# Patient Record
Sex: Male | Born: 1970 | Race: White | Hispanic: No | Marital: Married | Smoking: Never smoker
Health system: Southern US, Community
[De-identification: ages and names within clinical notes are randomized; demographics above are authoritative.]

## PROBLEM LIST (undated history)

## (undated) DIAGNOSIS — R7303 Prediabetes: Secondary | ICD-10-CM

## (undated) DIAGNOSIS — L409 Psoriasis, unspecified: Secondary | ICD-10-CM

## (undated) HISTORY — DX: Psoriasis, unspecified: L40.9

## (undated) HISTORY — PX: TONSILECTOMY, ADENOIDECTOMY, BILATERAL MYRINGOTOMY AND TUBES: SHX2538

---

## 2007-04-05 ENCOUNTER — Other Ambulatory Visit: Payer: Self-pay

## 2007-04-05 ENCOUNTER — Emergency Department: Payer: Self-pay | Admitting: Unknown Physician Specialty

## 2010-07-29 ENCOUNTER — Emergency Department: Payer: Self-pay | Admitting: Emergency Medicine

## 2017-06-07 ENCOUNTER — Other Ambulatory Visit: Payer: Self-pay

## 2017-06-26 ENCOUNTER — Encounter: Payer: Self-pay | Admitting: Urology

## 2017-06-26 ENCOUNTER — Ambulatory Visit (INDEPENDENT_AMBULATORY_CARE_PROVIDER_SITE_OTHER): Payer: 59 | Admitting: Urology

## 2017-06-26 VITALS — BP 134/90 | HR 88 | Ht 68.0 in | Wt 193.0 lb

## 2017-06-26 DIAGNOSIS — R3915 Urgency of urination: Secondary | ICD-10-CM

## 2017-06-26 DIAGNOSIS — R109 Unspecified abdominal pain: Secondary | ICD-10-CM | POA: Diagnosis not present

## 2017-06-26 LAB — URINALYSIS, COMPLETE
Bilirubin, UA: NEGATIVE
Glucose, UA: NEGATIVE
Ketones, UA: NEGATIVE
LEUKOCYTES UA: NEGATIVE
NITRITE UA: NEGATIVE
PH UA: 5.5 (ref 5.0–7.5)
Protein, UA: NEGATIVE
RBC, UA: NEGATIVE
Specific Gravity, UA: 1.02 (ref 1.005–1.030)
UUROB: 0.2 mg/dL (ref 0.2–1.0)

## 2017-06-26 LAB — BLADDER SCAN AMB NON-IMAGING: Scan Result: 26

## 2017-06-26 NOTE — Progress Notes (Signed)
06/26/2017 10:21 AM   Preston Crawford August 07, 1971 [id number]  Referring provider: Titus Mould, NP [address] [place], [place] [address]  Chief Complaint  Patient presents with  . Urinary Urgency    HPI: I was consulted to assess the patient's lower abdominal pain that comes and goes over the last several months.  It was somewhat nonspecific.  He did not have any bladder symptoms otherwise or burning or frequency or pain with ejaculation.  He has not had an x-ray or has been treated  He voids every few hours ago with a good flow and has no nocturia  He denies a history of urinary tract infections previous GU surgery and he has no neurologic issues.  His bowel movements are normal  Modifying factors: There are no other modifying factors  Associated signs and symptoms: There are no other associated signs and symptoms Aggravating and relieving factors: There are no other aggravating or relieving factors Severity: Mild Duration: Persistent   PMH: Past Medical History:  Diagnosis Date  . Psoriasis     Surgical History: Past Surgical History:  Procedure Laterality Date  . TONSILECTOMY, ADENOIDECTOMY, BILATERAL MYRINGOTOMY AND TUBES      Home Medications:  Allergies as of 06/26/2017   No Known Allergies     Medication List        Accurate as of 06/26/17 10:21 AM. Always use your most recent med list.          nortriptyline 10 MG capsule Commonly known as:  PAMELOR Take 10 mg at bedtime by mouth.   STELARA 45 MG/0.5ML Soln Generic drug:  Ustekinumab Inject into the skin.       Allergies: No Known Allergies  Family History: Family History  Problem Relation Age of Onset  . Prostate cancer Neg Hx   . Bladder Cancer Neg Hx   . Kidney cancer Neg Hx     Social History:  reports that  has never smoked. he has never used smokeless tobacco. He reports that he drinks alcohol. He reports that he does not use drugs.  ROS: UROLOGY Frequent Urination?:  No Hard to postpone urination?: Yes Burning/pain with urination?: No Get up at night to urinate?: No Leakage of urine?: Yes Urine stream starts and stops?: No Trouble starting stream?: No Do you have to strain to urinate?: No Blood in urine?: No Urinary tract infection?: No Sexually transmitted disease?: No Injury to kidneys or bladder?: No Painful intercourse?: No Weak stream?: No Erection problems?: No Penile pain?: No  Gastrointestinal Nausea?: No Vomiting?: No Indigestion/heartburn?: No Diarrhea?: No Constipation?: No  Constitutional Fever: No Night sweats?: No Weight loss?: No Fatigue?: No  Skin Skin rash/lesions?: No Itching?: No  Eyes Blurred vision?: No Double vision?: No  Ears/Nose/Throat Sore throat?: No Sinus problems?: No  Hematologic/Lymphatic Swollen glands?: No Easy bruising?: No  Cardiovascular Leg swelling?: No Chest pain?: No  Respiratory Cough?: No Shortness of breath?: No  Endocrine Excessive thirst?: No  Musculoskeletal Back pain?: No Joint pain?: No  Neurological Headaches?: Yes Dizziness?: No  Psychologic Depression?: No Anxiety?: No  Physical Exam: BP 134/90 (BP Location: Right Arm, Patient Position: Sitting, Cuff Size: Normal)   Pulse 88   Ht 5\' 8"  (1.727 m)   Wt 193 lb (87.5 kg)   BMI 29.35 kg/m   Constitutional:  Alert and oriented, No acute distress. HEENT: [place] AT, moist mucus membranes.  Trachea midline, no masses. Cardiovascular: No clubbing, cyanosis, or edema. Respiratory: Normal respiratory effort, no increased work  of breathing. GI: Abdomen is soft, nontender, nondistended, no abdominal masses GU: No hernia; small benign prostate; normal male genitalia Skin: No rashes, bruises or suspicious lesions. Lymph: No cervical or inguinal adenopathy. Neurologic: Grossly intact, no focal deficits, moving all 4 extremities. Psychiatric: Normal mood and affect.  Laboratory Data:  Urinalysis No results found  for: COLORURINE, APPEARANCEUR, LABSPEC, PHURINE, GLUCOSEU, HGBUR, BILIRUBINUR, KETONESUR, PROTEINUR, UROBILINOGEN, NITRITE, LEUKOCYTESUR  Pertinent Imaging: None  Assessment & Plan: The patient's presentation is nonspecific.  His urine was normal.  I did send it for culture.  The role of a CT scan was discussed.  I do not think he has prostatitis and I mentioned this.  I do not think he needs a cystoscopy at this stage especially with his nonspecific complaints and his negative urinalysis   We decided to send the urine for culture and he will return in 3 weeks with a CT scan.  There are no diagnoses linked to this encounter.  No Follow-up on file.  Martina SinnerMACDIARMID,Raeqwon Lux A, MD  [facility] [address], [address] [place], [place] [address] [phone]

## 2017-07-10 ENCOUNTER — Ambulatory Visit
Admission: RE | Admit: 2017-07-10 | Discharge: 2017-07-10 | Disposition: A | Discharge status: Home / Self Care | Payer: 59 | Source: Ambulatory Visit | Attending: Urology | Admitting: Urology

## 2017-07-10 DIAGNOSIS — R911 Solitary pulmonary nodule: Secondary | ICD-10-CM | POA: Insufficient documentation

## 2017-07-10 DIAGNOSIS — K760 Fatty (change of) liver, not elsewhere classified: Secondary | ICD-10-CM | POA: Diagnosis not present

## 2017-07-10 DIAGNOSIS — K8020 Calculus of gallbladder without cholecystitis without obstruction: Secondary | ICD-10-CM | POA: Diagnosis not present

## 2017-07-10 DIAGNOSIS — R109 Unspecified abdominal pain: Secondary | ICD-10-CM | POA: Insufficient documentation

## 2017-07-10 MED ORDER — IOPAMIDOL (ISOVUE-300) INJECTION 61%
100.0000 mL | Freq: Once | INTRAVENOUS | Status: AC | PRN
  Administered 2017-07-10: 100 mL via INTRAVENOUS

## 2017-07-17 ENCOUNTER — Encounter: Payer: Self-pay | Admitting: Urology

## 2017-07-17 ENCOUNTER — Ambulatory Visit (INDEPENDENT_AMBULATORY_CARE_PROVIDER_SITE_OTHER): Payer: 59 | Admitting: Urology

## 2017-07-17 VITALS — BP 130/79 | HR 66 | Ht 68.0 in | Wt 197.7 lb

## 2017-07-17 DIAGNOSIS — R109 Unspecified abdominal pain: Secondary | ICD-10-CM | POA: Diagnosis not present

## 2017-07-17 NOTE — Progress Notes (Signed)
07/17/2017 8:34 AM   Shirleen SchirmerBrian Savant 12/14/1970 [id number]  Referring provider: Titus MouldWhite, Elizabeth Burney, NP [address] [place], [place] [address]  Chief Complaint  Patient presents with  . Follow-up    CT results    HPI: I was consulted to assess the patient's lower abdominal pain that comes and goes over the last several months.  It was somewhat nonspecific.  He did not have any bladder symptoms otherwise or burning or frequency or pain with ejaculation.  He has not had an x-ray or has been treated  He voids every few hours ago with a good flow and has no nocturia  The patient's presentation is nonspecific.  His urine was normal.  I did send it for culture.  The role of a CT scan was discussed.  I do not think he has prostatitis and I mentioned this.  I do not think he needs a cystoscopy at this stage especially with his nonspecific complaints and his negative urinalysis   We decided to send the urine for culture and he will return in 3 weeks with a CT scan.  Today The patient has not had abdominal pain since.  Frequency stable.  Clinically not infected.  CT scan normal.  I did not see the urine culture.  He does not have symptoms of prostatitis   PMH: Past Medical History:  Diagnosis Date  . Psoriasis     Surgical History: Past Surgical History:  Procedure Laterality Date  . TONSILECTOMY, ADENOIDECTOMY, BILATERAL MYRINGOTOMY AND TUBES      Home Medications:  Allergies as of 07/17/2017   No Known Allergies     Medication List        Accurate as of 07/17/17  8:34 AM. Always use your most recent med list.          nortriptyline 10 MG capsule Commonly known as:  PAMELOR Take 10 mg at bedtime by mouth.   STELARA 45 MG/0.5ML Soln Generic drug:  Ustekinumab Inject into the skin.       Allergies: No Known Allergies  Family History: Family History  Problem Relation Age of Onset  . Prostate cancer Neg Hx   . Bladder Cancer Neg Hx   . Kidney cancer Neg  Hx     Social History:  reports that  has never smoked. he has never used smokeless tobacco. He reports that he drinks alcohol. He reports that he does not use drugs.  ROS: UROLOGY Frequent Urination?: No Hard to postpone urination?: Yes Burning/pain with urination?: No Get up at night to urinate?: No Leakage of urine?: Yes Urine stream starts and stops?: No Trouble starting stream?: No Do you have to strain to urinate?: No Blood in urine?: No Urinary tract infection?: No Sexually transmitted disease?: No Injury to kidneys or bladder?: No Painful intercourse?: No Weak stream?: No Erection problems?: No Penile pain?: No  Gastrointestinal Nausea?: No Vomiting?: No Indigestion/heartburn?: No Diarrhea?: No Constipation?: No  Constitutional Fever: No Night sweats?: No Weight loss?: No Fatigue?: No  Skin Skin rash/lesions?: No Itching?: No  Eyes Blurred vision?: No Double vision?: No  Ears/Nose/Throat Sore throat?: No Sinus problems?: No  Hematologic/Lymphatic Swollen glands?: No Easy bruising?: No  Cardiovascular Leg swelling?: No Chest pain?: No  Respiratory Cough?: No Shortness of breath?: No  Endocrine Excessive thirst?: No  Musculoskeletal Back pain?: No Joint pain?: No  Neurological Headaches?: No Dizziness?: No  Psychologic Depression?: No Anxiety?: No  Physical Exam: BP 130/79 (BP Location: Right Arm, Patient Position:  Sitting, Cuff Size: Normal)   Pulse 66   Ht 5\' 8"  (1.727 m)   Wt 197 lb 11.2 oz (89.7 kg)   BMI 30.06 kg/m   Constitutional:  Alert and oriented, No acute distress.  Laboratory Data: No results found for: WBC, HGB, HCT, MCV, PLT  No results found for: CREATININE  No results found for: PSA  No results found for: TESTOSTERONE  No results found for: HGBA1C  Urinalysis    Component Value Date/Time   APPEARANCEUR Clear 06/26/2017 1012   GLUCOSEU Negative 06/26/2017 1012   BILIRUBINUR Negative 06/26/2017  1012   PROTEINUR Negative 06/26/2017 1012   NITRITE Negative 06/26/2017 1012   LEUKOCYTESUR Negative 06/26/2017 1012    Pertinent Imaging: As above  Assessment & Plan: We discussed constipation.  I will see him as needed  There are no diagnoses linked to this encounter.  No Follow-up on file.  Martina SinnerMACDIARMID,Tejasvi Brissett A, MD  [facility] [address], [address] [place], [place] [address] [phone]

## 2019-06-21 ENCOUNTER — Other Ambulatory Visit: Payer: Self-pay | Admitting: Family Medicine

## 2019-06-21 DIAGNOSIS — E049 Nontoxic goiter, unspecified: Secondary | ICD-10-CM

## 2019-07-04 ENCOUNTER — Other Ambulatory Visit: Payer: Self-pay

## 2019-07-04 ENCOUNTER — Ambulatory Visit
Admission: RE | Admit: 2019-07-04 | Discharge: 2019-07-04 | Disposition: A | Discharge status: Home / Self Care | Payer: 59 | Source: Ambulatory Visit | Attending: Family Medicine | Admitting: Family Medicine

## 2019-07-04 DIAGNOSIS — E049 Nontoxic goiter, unspecified: Secondary | ICD-10-CM | POA: Insufficient documentation

## 2021-07-30 ENCOUNTER — Ambulatory Visit
Admission: RE | Admit: 2021-07-30 | Discharge: 2021-07-30 | Disposition: A | Discharge status: Home / Self Care | Payer: No Typology Code available for payment source | Attending: Family Medicine | Admitting: Family Medicine

## 2021-07-30 ENCOUNTER — Other Ambulatory Visit: Payer: Self-pay

## 2021-07-30 ENCOUNTER — Other Ambulatory Visit: Payer: Self-pay | Admitting: Family Medicine

## 2021-07-30 ENCOUNTER — Ambulatory Visit
Admission: RE | Admit: 2021-07-30 | Discharge: 2021-07-30 | Disposition: A | Discharge status: Home / Self Care | Payer: No Typology Code available for payment source | Source: Ambulatory Visit | Attending: Family Medicine | Admitting: Family Medicine

## 2021-07-30 DIAGNOSIS — M79671 Pain in right foot: Secondary | ICD-10-CM

## 2021-07-30 DIAGNOSIS — M79672 Pain in left foot: Secondary | ICD-10-CM

## 2021-07-30 DIAGNOSIS — M25475 Effusion, left foot: Secondary | ICD-10-CM

## 2021-08-08 LAB — EXTERNAL GENERIC LAB PROCEDURE: COLOGUARD: NEGATIVE

## 2022-04-18 IMAGING — CR DG FOOT COMPLETE 3+V*L*
3 series · 3 of 3 positions shown · non-contrast
Comparison: None.

CLINICAL DATA: Pain and swelling without known injury.

EXAM:
LEFT FOOT - COMPLETE 3+ VIEW

[foot ap]
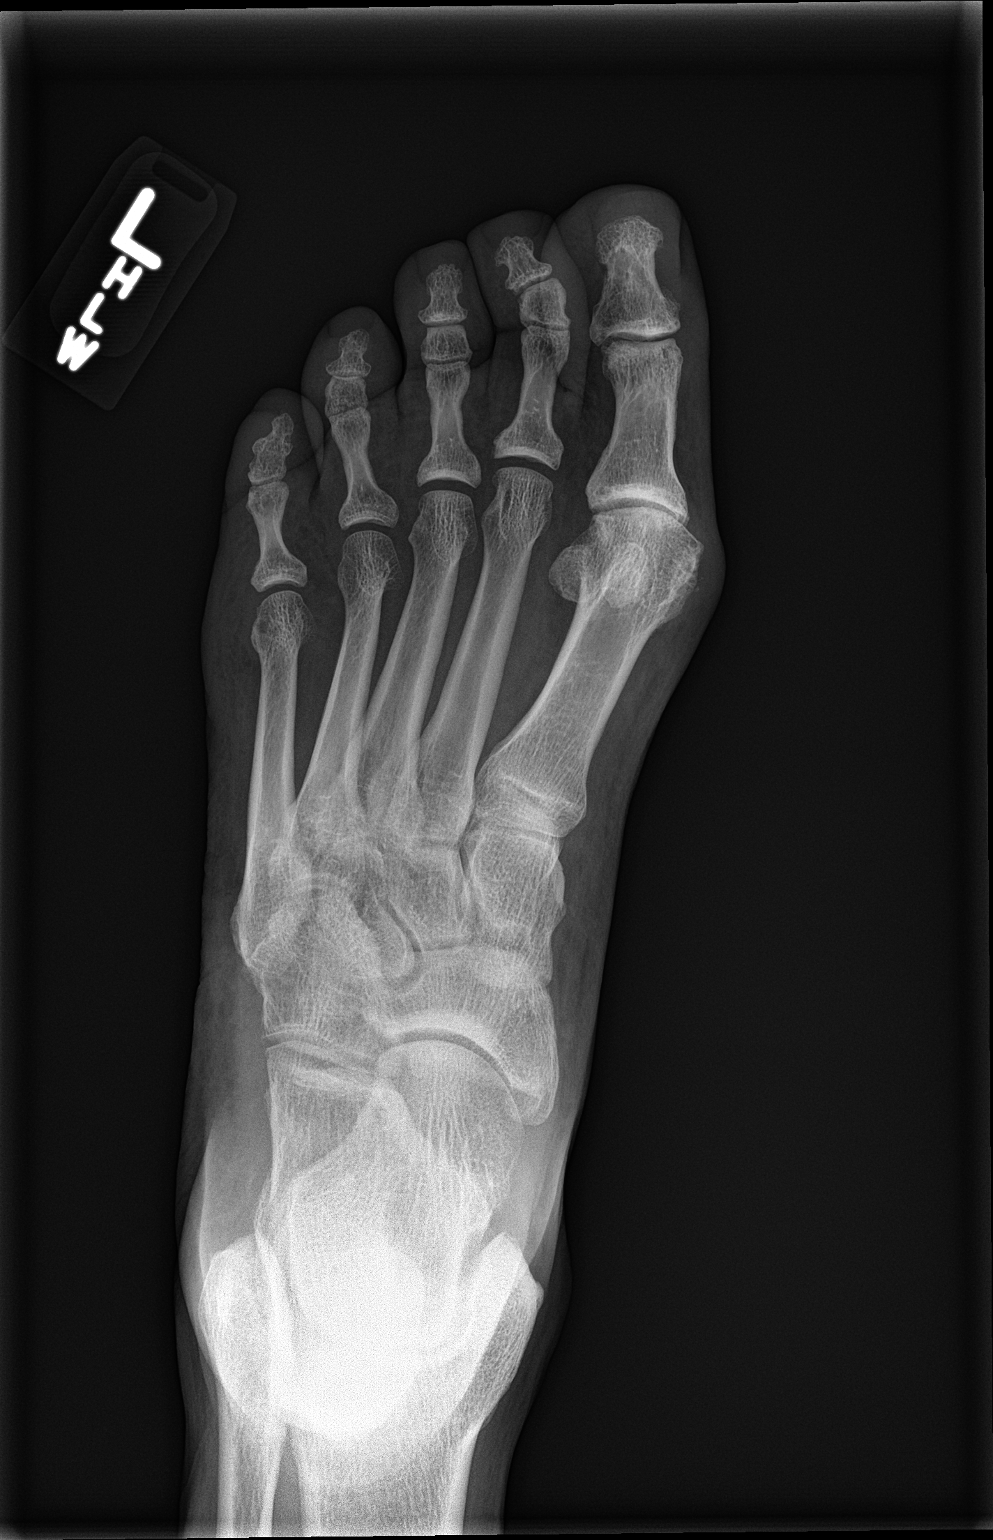

[foot obl]
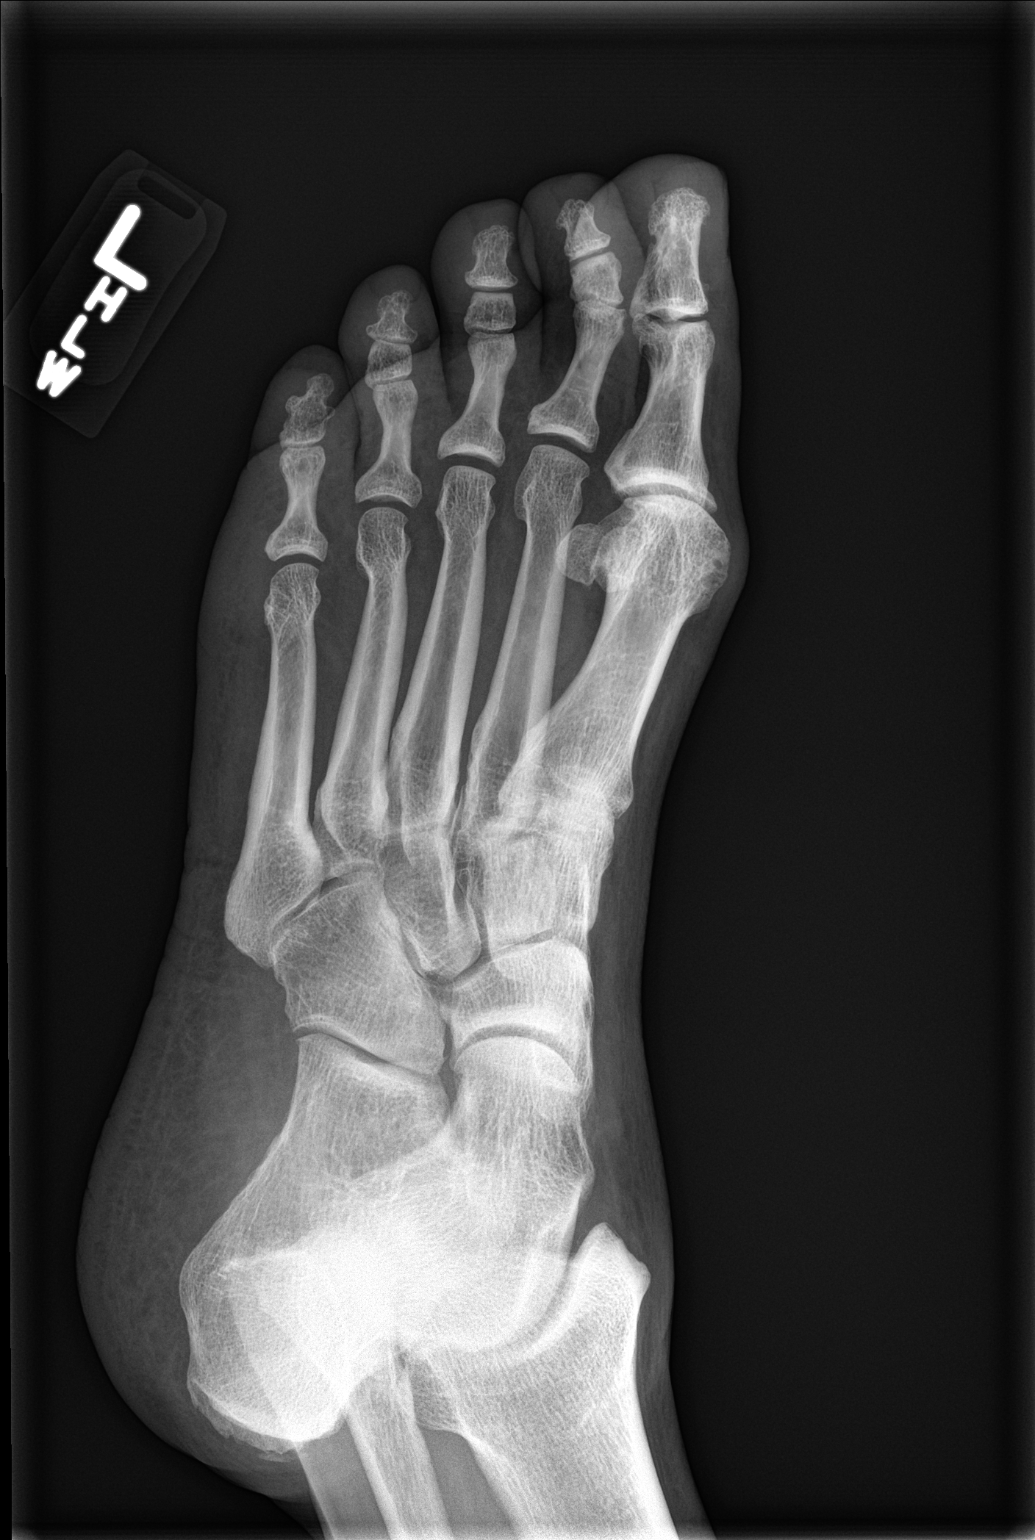

[foot lat]
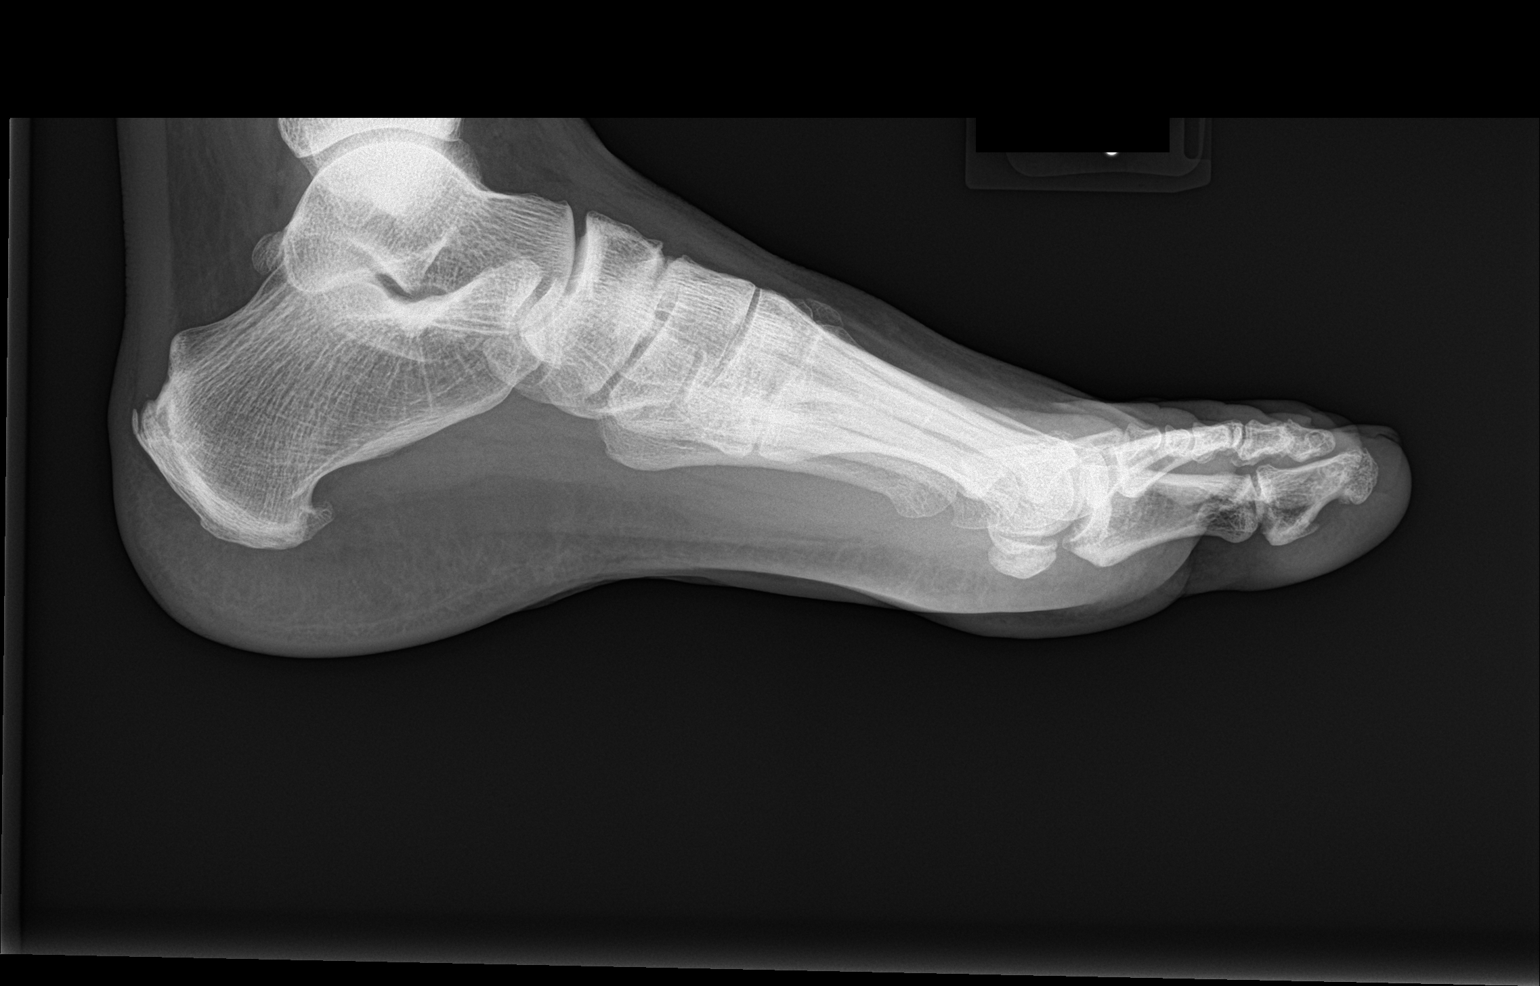

[3 of 3 positions shown; findings below may reference images not displayed]

FINDINGS: Mild degenerative changes in the midfoot seen on the lateral view.
Small plantar spur. Mild enthesopathic changes at the Achilles
insertion site on the posterior calcaneus. No other abnormalities.
IMPRESSION: No acute abnormalities. Mild degenerative and enthesopathic changes
as above.

## 2022-04-25 ENCOUNTER — Ambulatory Visit (INDEPENDENT_AMBULATORY_CARE_PROVIDER_SITE_OTHER): Payer: No Typology Code available for payment source

## 2022-04-25 ENCOUNTER — Ambulatory Visit
Admission: RE | Admit: 2022-04-25 | Discharge: 2022-04-25 | Disposition: A | Discharge status: Home / Self Care | Payer: No Typology Code available for payment source | Source: Ambulatory Visit

## 2022-04-25 VITALS — BP 123/84 | HR 56 | Temp 98.2°F | Resp 18 | Ht 68.0 in | Wt 190.0 lb

## 2022-04-25 DIAGNOSIS — M79644 Pain in right finger(s): Secondary | ICD-10-CM | POA: Diagnosis not present

## 2022-04-25 DIAGNOSIS — L089 Local infection of the skin and subcutaneous tissue, unspecified: Secondary | ICD-10-CM

## 2022-04-25 DIAGNOSIS — S60414A Abrasion of right ring finger, initial encounter: Secondary | ICD-10-CM | POA: Diagnosis not present

## 2022-04-25 LAB — BASIC METABOLIC PANEL
Anion gap: 4 — ABNORMAL LOW (ref 5–15)
BUN: 19 mg/dL (ref 6–20)
CO2: 24 mmol/L (ref 22–32)
Calcium: 8.9 mg/dL (ref 8.9–10.3)
Chloride: 109 mmol/L (ref 98–111)
Creatinine, Ser: 0.75 mg/dL (ref 0.61–1.24)
GFR, Estimated: >60 mL/min (ref >60)
Glucose, Bld: 101 mg/dL — ABNORMAL HIGH (ref 70–99)
Potassium: 4.3 mmol/L (ref 3.5–5.1)
Sodium: 137 mmol/L (ref 135–145)

## 2022-04-25 LAB — CBC WITH DIFFERENTIAL/PLATELET
Abs Immature Granulocytes: 0.02 10*3/uL (ref 0.00–0.07)
Basophils Absolute: 0 10*3/uL (ref 0.0–0.1)
Basophils Relative: 0 %
Eosinophils Absolute: 0.1 10*3/uL (ref 0.0–0.5)
Eosinophils Relative: 1 %
HCT: 40.3 % (ref 39.0–52.0)
Hemoglobin: 13.6 g/dL (ref 13.0–17.0)
Immature Granulocytes: 0 %
Lymphocytes Relative: 17 %
Lymphs Abs: 1.3 10*3/uL (ref 0.7–4.0)
MCH: 30.2 pg (ref 26.0–34.0)
MCHC: 33.7 g/dL (ref 30.0–36.0)
MCV: 89.6 fL (ref 80.0–100.0)
Monocytes Absolute: 0.9 10*3/uL (ref 0.1–1.0)
Monocytes Relative: 11 %
Neutro Abs: 5.6 10*3/uL (ref 1.7–7.7)
Neutrophils Relative %: 71 %
Platelets: 226 10*3/uL (ref 150–400)
RBC: 4.5 MIL/uL (ref 4.22–5.81)
RDW: 13.2 % (ref 11.5–15.5)
WBC: 7.9 10*3/uL (ref 4.0–10.5)
nRBC: 0 % (ref 0.0–0.2)

## 2022-04-25 MED ORDER — SULFAMETHOXAZOLE-TRIMETHOPRIM 800-160 MG PO TABS: 1.0000 | ORAL_TABLET | Freq: Two times a day (BID) | ORAL | 0 refills | Status: DC

## 2022-04-25 MED ORDER — TETANUS-DIPHTH-ACELL PERTUSSIS 5-2.5-18.5 LF-MCG/0.5 IM SUSY: 0.5000 mL | PREFILLED_SYRINGE | Freq: Once | INTRAMUSCULAR | Status: DC

## 2022-04-25 MED ORDER — MUPIROCIN 2 % EX OINT: 1.0000 | TOPICAL_OINTMENT | Freq: Two times a day (BID) | CUTANEOUS | 0 refills | Status: AC

## 2022-04-25 NOTE — ED Triage Notes (Signed)
Pt here with C/O right hand swelling since Saturday night, noticed a cut/bump on right ring finger. No known injury.

## 2022-04-25 NOTE — Discharge Instructions (Addendum)
I am concerned that you have an infection.  Your x-ray was normal.  Your blood counts are normal which is encouraging.  Start Bactrim DS.  If you have any rash or lesions stop the medication to be seen immediately.  Keep this area clean with soap and water and apply Bactroban ointment.  Use Tylenol and ibuprofen for pain.  If your symptoms are proving quickly or if anything worsens and you have increased swelling, increased redness, increased pain, fever, nausea, vomiting you should be seen immediately.

## 2022-04-25 NOTE — ED Provider Notes (Signed)
[facility]    CSN: [id number] Arrival date & time: 04/25/22  1040      History   Chief Complaint Chief Complaint  Patient presents with   Appointment    11:00   Hand Pain    HPI Preston Crawford is a 51 y.o. male.   Patient presents today with a 3-day history of swelling and pain in his right ring finger.  He does report there is a small wound over his PIP joint and believes that this is the cause of symptoms.  He denies any known injury or change in activity prior to symptom onset.  He reports pain is rated 5 on a 0-10 pain scale, described as throbbing, worse with attempted flexion, no alleviating factors identified.  He is right-handed.  Denies any numbness or paresthesias.  He has not been applying any over-the-counter medication for symptom management.  Denies any fever, nausea, vomiting, weakness.  Denies any recent antibiotics in the past 90 days.  Denies history of MRSA or recurrent skin infections.  He does have a history of stress Stelara which he has been taking as prescribed.  He is concerned for secondary infection given immune modulating medication.  His last tetanus was 2018.    Past Medical History:  Diagnosis Date   Psoriasis     There are no problems to display for this patient.   Past Surgical History:  Procedure Laterality Date   TONSILECTOMY, ADENOIDECTOMY, BILATERAL MYRINGOTOMY AND TUBES         Home Medications    Prior to Admission medications   Medication Sig Start Date End Date Taking? Authorizing Provider  metFORMIN (GLUCOPHAGE-XR) 500 MG 24 hr tablet Take 500 mg by mouth daily. 04/01/22  Yes [provider]  mupirocin ointment (BACTROBAN) 2 % Apply 1 Application topically 2 (two) times daily. 04/25/22  Yes Jahnavi Muratore K, PA-C  sulfamethoxazole-trimethoprim (BACTRIM DS) 800-160 MG tablet Take 1 tablet by mouth 2 (two) times daily for 7 days. 04/25/22 05/02/22 Yes Kwaku Mostafa K, PA-C  Ustekinumab (STELARA) 45 MG/0.5ML SOLN Inject  into the skin.   Yes [provider]    Family History Family History  Problem Relation Age of Onset   Prostate cancer Neg Hx    Bladder Cancer Neg Hx    Kidney cancer Neg Hx     Social History Social History   Tobacco Use   Smoking status: Never   Smokeless tobacco: Never  Vaping Use   Vaping Use: Never used  Substance Use Topics   Alcohol use: Yes   Drug use: No     Allergies   Patient has no known allergies.   Review of Systems Review of Systems  Constitutional:  Positive for activity change. Negative for appetite change, fatigue and fever.  Gastrointestinal:  Negative for abdominal pain, diarrhea, nausea and vomiting.  Musculoskeletal:  Positive for arthralgias. Negative for myalgias.  Skin:  Positive for color change and wound.     Physical Exam Triage Vital Signs ED Triage Vitals  Enc Vitals Group     BP 04/25/22 1122 123/84     Pulse Rate 04/25/22 1122 (!) 56     Resp 04/25/22 1122 18     Temp 04/25/22 1122 98.2 F (36.8 C)     Temp Source 04/25/22 1122 Oral     SpO2 04/25/22 1122 100 %     Weight 04/25/22 1119 190 lb (86.2 kg)     Height 04/25/22 1119 5\' 8"  (1.727 m)  Head Circumference --      Peak Flow --      Pain Score 04/25/22 1119 0     Pain Loc --      Pain Edu? --      Excl. in GC? --    No data found.  Updated Vital Signs BP 123/84 (BP Location: Left Arm)   Pulse (!) 56   Temp 98.2 F (36.8 C) (Oral)   Resp 18   Ht 5\' 8"  (1.727 m)   Wt 190 lb (86.2 kg)   SpO2 100%   BMI 28.89 kg/m   Visual Acuity Right Eye Distance:   Left Eye Distance:   Bilateral Distance:    Right Eye Near:   Left Eye Near:    Bilateral Near:     Physical Exam Vitals reviewed.  Constitutional:      General: He is awake.     Appearance: Normal appearance. He is well-developed. He is not ill-appearing.     Comments: Very pleasant male appears stated age in no acute distress sitting comfortably in exam room  HENT:     Head:  Normocephalic and atraumatic.  Cardiovascular:     Rate and Rhythm: Normal rate and regular rhythm.     Heart sounds: Normal heart sounds, S1 normal and S2 normal. No murmur heard.    Comments: Capillary refill within 2 seconds right fingers Pulmonary:     Effort: Pulmonary effort is normal.     Breath sounds: Normal breath sounds. No stridor. No wheezing, rhonchi or rales.     Comments: Clear to auscultation bilaterally Musculoskeletal:     Right hand: Swelling and tenderness present. No bony tenderness. Decreased range of motion. There is no disruption of two-point discrimination. Normal capillary refill.     Comments: Right ring finger: Significant swelling of right ring finger into hand with associated erythema.  0.5 cm laceration noted over PIP without bleeding or drainage.  Tenderness palpation over proximal right ring finger.  No deformity noted.  Decreased flexion secondary to swelling.  Hand neurovascularly intact.  No streaking or evidence of lymphangitis.  Neurological:     Mental Status: He is alert.  Psychiatric:        Behavior: Behavior is cooperative.      UC Treatments / Results  Labs (all labs ordered are listed, but only abnormal results are displayed) Labs Reviewed  CBC WITH DIFFERENTIAL/PLATELET  BASIC METABOLIC PANEL    EKG   Radiology DG Finger Ring Right  Result Date: 04/25/2022 CLINICAL DATA:  Ring finger pain and swelling for 2 days. No known injury. EXAM: RIGHT RING FINGER 2+V COMPARISON:  None Available. FINDINGS: There is no evidence of fracture or dislocation. There is no evidence of arthropathy or other focal bone abnormality. Proximal soft tissue swelling is seen, without evidence of soft tissue gas or radiopaque foreign body. IMPRESSION: Proximal soft tissue swelling. No osseous abnormality. Electronically Signed   By: 06/25/2022 M.D.   On: 04/25/2022 12:07    Procedures Procedures (including critical care time)  Medications Ordered in  UC Medications - No data to display   Initial Impression / Assessment and Plan / UC Course  I have reviewed the triage vital signs and the nursing notes.  Pertinent labs & imaging results that were available during my care of the patient were reviewed by me and considered in my medical decision making (see chart for details).     Patient is well-appearing, afebrile, nontoxic, nontachycardic.  He is  up-to-date on his tetanus so this was not updated today.  X-ray was obtained given severity of swelling which showed no evidence of osteomyelitis.  CBC obtained today and were appropriate.  BMP was pending at the time of discharge but we will contact him if we need to make any adjustments to his treatment plan including medication dose adjustment based on this result.  We will start patient on Bactrim DS.  He was instructed to keep area clean and apply Bactroban ointment with dressing changes.  Can also use Tylenol or Profen for pain.  Discussed that if he has any worsening symptoms including spread of erythema or swelling, fever, nausea, vomiting he needs to be seen immediately.  Recommended close follow-up with his primary care.  Strict return precautions given.  Final Clinical Impressions(s) / UC Diagnoses   Final diagnoses:  Abrasion of right ring finger with infection  Infection of finger     Discharge Instructions      I am concerned that you have an infection.  Your x-ray was normal.  Your blood counts are normal which is encouraging.  Start Bactrim DS.  If you have any rash or lesions stop the medication to be seen immediately.  Keep this area clean with soap and water and apply Bactroban ointment.  Use Tylenol and ibuprofen for pain.  If your symptoms are proving quickly or if anything worsens and you have increased swelling, increased redness, increased pain, fever, nausea, vomiting you should be seen immediately.     ED Prescriptions     Medication Sig Dispense Auth. Provider    sulfamethoxazole-trimethoprim (BACTRIM DS) 800-160 MG tablet Take 1 tablet by mouth 2 (two) times daily for 7 days. 14 tablet Jun Rightmyer K, PA-C   mupirocin ointment (BACTROBAN) 2 % Apply 1 Application topically 2 (two) times daily. 22 g Jenea Dake K, PA-C      PDMP not reviewed this encounter.   Jeani Hawking, PA-C 04/25/22 1219

## 2022-04-27 ENCOUNTER — Inpatient Hospital Stay
Admission: EM | Admit: 2022-04-27 | Discharge: 2022-04-29 | DRG: 603 | Disposition: A | Discharge status: Home / Self Care | Payer: No Typology Code available for payment source | Attending: Internal Medicine | Admitting: Internal Medicine

## 2022-04-27 ENCOUNTER — Emergency Department: Payer: No Typology Code available for payment source

## 2022-04-27 ENCOUNTER — Other Ambulatory Visit: Payer: Self-pay

## 2022-04-27 ENCOUNTER — Ambulatory Visit
Admission: RE | Admit: 2022-04-27 | Discharge: 2022-04-27 | Disposition: A | Discharge status: Home / Self Care | Payer: No Typology Code available for payment source | Source: Ambulatory Visit | Attending: Emergency Medicine | Admitting: Emergency Medicine

## 2022-04-27 VITALS — BP 129/83 | HR 66 | Temp 97.9°F | Resp 12

## 2022-04-27 DIAGNOSIS — L409 Psoriasis, unspecified: Secondary | ICD-10-CM | POA: Diagnosis not present

## 2022-04-27 DIAGNOSIS — L089 Local infection of the skin and subcutaneous tissue, unspecified: Secondary | ICD-10-CM | POA: Diagnosis not present

## 2022-04-27 DIAGNOSIS — F1090 Alcohol use, unspecified, uncomplicated: Secondary | ICD-10-CM | POA: Diagnosis present

## 2022-04-27 DIAGNOSIS — L02519 Cutaneous abscess of unspecified hand: Principal | ICD-10-CM

## 2022-04-27 DIAGNOSIS — E119 Type 2 diabetes mellitus without complications: Secondary | ICD-10-CM

## 2022-04-27 DIAGNOSIS — M7989 Other specified soft tissue disorders: Secondary | ICD-10-CM | POA: Diagnosis present

## 2022-04-27 DIAGNOSIS — S60414A Abrasion of right ring finger, initial encounter: Secondary | ICD-10-CM | POA: Diagnosis not present

## 2022-04-27 DIAGNOSIS — L02511 Cutaneous abscess of right hand: Secondary | ICD-10-CM | POA: Diagnosis not present

## 2022-04-27 DIAGNOSIS — Z79899 Other long term (current) drug therapy: Secondary | ICD-10-CM

## 2022-04-27 DIAGNOSIS — L03011 Cellulitis of right finger: Secondary | ICD-10-CM | POA: Diagnosis present

## 2022-04-27 DIAGNOSIS — B9562 Methicillin resistant Staphylococcus aureus infection as the cause of diseases classified elsewhere: Secondary | ICD-10-CM | POA: Diagnosis present

## 2022-04-27 DIAGNOSIS — Z7984 Long term (current) use of oral hypoglycemic drugs: Secondary | ICD-10-CM

## 2022-04-27 DIAGNOSIS — G312 Degeneration of nervous system due to alcohol: Secondary | ICD-10-CM | POA: Diagnosis present

## 2022-04-27 HISTORY — DX: Prediabetes: R73.03

## 2022-04-27 LAB — CBC WITH DIFFERENTIAL/PLATELET
Abs Immature Granulocytes: 0.03 10*3/uL (ref 0.00–0.07)
Basophils Absolute: 0 10*3/uL (ref 0.0–0.1)
Basophils Relative: 0 %
Eosinophils Absolute: 0.1 10*3/uL (ref 0.0–0.5)
Eosinophils Relative: 1 %
HCT: 40 % (ref 39.0–52.0)
Hemoglobin: 13.4 g/dL (ref 13.0–17.0)
Immature Granulocytes: 0 %
Lymphocytes Relative: 12 %
Lymphs Abs: 1.2 10*3/uL (ref 0.7–4.0)
MCH: 29.8 pg (ref 26.0–34.0)
MCHC: 33.5 g/dL (ref 30.0–36.0)
MCV: 88.9 fL (ref 80.0–100.0)
Monocytes Absolute: 1.1 10*3/uL — ABNORMAL HIGH (ref 0.1–1.0)
Monocytes Relative: 11 %
Neutro Abs: 7.8 10*3/uL — ABNORMAL HIGH (ref 1.7–7.7)
Neutrophils Relative %: 76 %
Platelets: 251 10*3/uL (ref 150–400)
RBC: 4.5 MIL/uL (ref 4.22–5.81)
RDW: 13.1 % (ref 11.5–15.5)
WBC: 10.3 10*3/uL (ref 4.0–10.5)
nRBC: 0 % (ref 0.0–0.2)

## 2022-04-27 LAB — BASIC METABOLIC PANEL
Anion gap: 8 (ref 5–15)
BUN: 22 mg/dL — ABNORMAL HIGH (ref 6–20)
CO2: 24 mmol/L (ref 22–32)
Calcium: 9.2 mg/dL (ref 8.9–10.3)
Chloride: 107 mmol/L (ref 98–111)
Creatinine, Ser: 0.98 mg/dL (ref 0.61–1.24)
GFR, Estimated: >60 mL/min (ref >60)
Glucose, Bld: 118 mg/dL — ABNORMAL HIGH (ref 70–99)
Potassium: 4.2 mmol/L (ref 3.5–5.1)
Sodium: 139 mmol/L (ref 135–145)

## 2022-04-27 LAB — SEDIMENTATION RATE: Sed Rate: 17 mm/hr (ref 0–20)

## 2022-04-27 MED ORDER — MAGNESIUM HYDROXIDE 400 MG/5ML PO SUSP: 30.0000 mL | Freq: Every day | ORAL | Status: DC | PRN

## 2022-04-27 MED ORDER — CEPHALEXIN 500 MG PO CAPS: 500.0000 mg | ORAL_CAPSULE | Freq: Two times a day (BID) | ORAL | 0 refills | Status: DC

## 2022-04-27 MED ORDER — OXYCODONE-ACETAMINOPHEN 5-325 MG PO TABS
1.0000 | ORAL_TABLET | Freq: Once | ORAL | Status: AC
  Administered 2022-04-28: 1 via ORAL
  Filled 2022-04-27: qty 1

## 2022-04-27 MED ORDER — SODIUM CHLORIDE 0.9 % IV SOLN: 3.0000 g | Freq: Once | INTRAVENOUS | Status: DC

## 2022-04-27 MED ORDER — ONDANSETRON HCL 4 MG/2ML IJ SOLN: 4.0000 mg | Freq: Four times a day (QID) | INTRAMUSCULAR | Status: DC | PRN

## 2022-04-27 MED ORDER — MUPIROCIN 2 % EX OINT
1.0000 | TOPICAL_OINTMENT | Freq: Two times a day (BID) | CUTANEOUS | Status: DC
  Filled 2022-04-27 (×2): qty 22

## 2022-04-27 MED ORDER — OXYCODONE HCL 5 MG PO TABS: 5.0000 mg | ORAL_TABLET | Freq: Four times a day (QID) | ORAL | Status: DC | PRN

## 2022-04-27 MED ORDER — SODIUM CHLORIDE 0.9 % IV SOLN: INTRAVENOUS | Status: DC

## 2022-04-27 MED ORDER — ENOXAPARIN SODIUM 40 MG/0.4ML IJ SOSY
40.0000 mg | PREFILLED_SYRINGE | INTRAMUSCULAR | Status: DC
  Administered 2022-04-28: 40 mg via SUBCUTANEOUS
  Filled 2022-04-27 (×2): qty 0.4

## 2022-04-27 MED ORDER — VANCOMYCIN HCL IN DEXTROSE 1-5 GM/200ML-% IV SOLN
1000.0000 mg | Freq: Once | INTRAVENOUS | Status: AC
  Administered 2022-04-27: 1000 mg via INTRAVENOUS
  Filled 2022-04-27: qty 200

## 2022-04-27 MED ORDER — SODIUM CHLORIDE 0.9 % IV SOLN
100.0000 mg | Freq: Once | INTRAVENOUS | Status: AC
  Administered 2022-04-28: 100 mg via INTRAVENOUS
  Filled 2022-04-27: qty 100

## 2022-04-27 MED ORDER — ACETAMINOPHEN 325 MG PO TABS: 650.0000 mg | ORAL_TABLET | Freq: Four times a day (QID) | ORAL | Status: DC | PRN

## 2022-04-27 MED ORDER — METFORMIN HCL ER 500 MG PO TB24
500.0000 mg | ORAL_TABLET | Freq: Every day | ORAL | Status: DC
  Administered 2022-04-28: 500 mg via ORAL
  Filled 2022-04-27 (×2): qty 1

## 2022-04-27 MED ORDER — ACETAMINOPHEN 650 MG RE SUPP: 650.0000 mg | Freq: Four times a day (QID) | RECTAL | Status: DC | PRN

## 2022-04-27 MED ORDER — SODIUM CHLORIDE 0.9 % IV SOLN
1.0000 g | Freq: Once | INTRAVENOUS | Status: AC
  Administered 2022-04-27: 1 g via INTRAVENOUS
  Filled 2022-04-27: qty 10

## 2022-04-27 MED ORDER — SODIUM CHLORIDE 0.9 % IV SOLN
1.0000 g | INTRAVENOUS | Status: DC
  Administered 2022-04-28: 1 g via INTRAVENOUS
  Filled 2022-04-27 (×2): qty 10

## 2022-04-27 MED ORDER — TRAZODONE HCL 50 MG PO TABS: 25.0000 mg | ORAL_TABLET | Freq: Every evening | ORAL | Status: DC | PRN

## 2022-04-27 MED ORDER — ONDANSETRON HCL 4 MG PO TABS: 4.0000 mg | ORAL_TABLET | Freq: Four times a day (QID) | ORAL | Status: DC | PRN

## 2022-04-27 MED ORDER — OXYCODONE HCL 5 MG PO TABS: 5.0000 mg | ORAL_TABLET | Freq: Four times a day (QID) | ORAL | 0 refills | Status: DC | PRN

## 2022-04-27 NOTE — ED Notes (Signed)
Called [facility] transfer [facility]), will page out but hospital at capacity

## 2022-04-27 NOTE — Discharge Instructions (Signed)
Continue use of Bactrim as prescribed  Begin use of Keflex every morning and every evening days for additional bacterial coverage  You may take Tylenol and ibuprofen as needed for pain, may use oxycodone every 6 hours as needed if ineffective, be mindful this medication may make you drowsy  May place ice or heat over the affected areas in 10 to 15-minute intervals to help with comfort and swelling  If You do not see improvement within the next 48 hours please go to the nearest emergency department for evaluation for IV medications

## 2022-04-27 NOTE — H&P (Signed)
[place]   PATIENT NAME: Preston Crawford    MR#:  [id number]  DATE OF BIRTH:  [birth year]  DATE OF ADMISSION:  04/27/2022  PRIMARY CARE PHYSICIAN: Jose-Mathews, [name], MD   Patient is coming from: Home  REQUESTING/REFERRING PHYSICIAN: [name], MD  CHIEF COMPLAINT:   Chief Complaint  Patient presents with  . Hand Pain    HISTORY OF PRESENT ILLNESS:  Preston Crawford is a 51 y.o. male with medical history significant for prediabetes and psoriasis, who presented to the emergency room with acute onset of right ring finger swelling and worsening pain and erythema.  He has been having diminished range of motion and today has not been able to fully flex his right ring finger.  He was evaluated a couple of days at an urgent care and was started on p.o. Bactrim.  Later on he was prescribed p.o. Keflex.  No fever or chills or drainage.  He denies any tingling or numbness.  ED Course: Upon presentation to the emergency room, heart rate was 156 with otherwise unremarkable CMP. Labs revealed a BUN of 22 with a glucose of 118 and otherwise unremarkable   Imaging: Right hand x-ray revealed soft tissue swelling that may represent cellulitis.  The patient was given a gram of IV Rocephin, a gram of IV vancomycin and 100 mg of p.o. doxycycline.  Dr. Okey Dupre was notified about the patient and is aware.  He initially commended transfer however transfer to [facility] was declined.  He will be admitted to an observation medical bed for further evaluation and management. PAST MEDICAL HISTORY:   Past Medical History:  Diagnosis Date  . Prediabetes   . Psoriasis     PAST SURGICAL HISTORY:   Past Surgical History:  Procedure Laterality Date  . TONSILECTOMY, ADENOIDECTOMY, BILATERAL MYRINGOTOMY AND TUBES      SOCIAL HISTORY:   Social History   Tobacco Use  . Smoking status: Never  . Smokeless tobacco: Never  Substance Use Topics  . Alcohol use: Yes    FAMILY HISTORY:   Family  History  Problem Relation Age of Onset  . Prostate cancer Neg Hx   . Bladder Cancer Neg Hx   . Kidney cancer Neg Hx     DRUG ALLERGIES:  No Known Allergies  REVIEW OF SYSTEMS:   ROS As per history of present illness. All pertinent systems were reviewed above. Constitutional, HEENT, cardiovascular, respiratory, GI, GU, musculoskeletal, neuro, psychiatric, endocrine, integumentary and hematologic systems were reviewed and are otherwise negative/unremarkable except for positive findings mentioned above in the HPI.   MEDICATIONS AT HOME:   Prior to Admission medications   Medication Sig Start Date End Date Taking? Authorizing Provider  cephALEXin (KEFLEX) 500 MG capsule Take 1 capsule (500 mg total) by mouth 2 (two) times daily for 5 days. 04/27/22 05/02/22  [name], NP  metFORMIN (GLUCOPHAGE-XR) 500 MG 24 hr tablet Take 500 mg by mouth daily. 04/01/22   [provider]  mupirocin ointment (BACTROBAN) 2 % Apply 1 Application topically 2 (two) times daily. 04/25/22   Raspet, Noberto Retort, PA-C  oxyCODONE (ROXICODONE) 5 MG immediate release tablet Take 1 tablet (5 mg total) by mouth every 6 (six) hours as needed for up to 5 days for severe pain. 04/27/22 05/02/22  [name], NP  sulfamethoxazole-trimethoprim (BACTRIM DS) 800-160 MG tablet Take 1 tablet by mouth 2 (two) times daily for 7 days. 04/25/22 05/02/22  Raspet, Noberto Retort, PA-C  Ustekinumab [name])  45 MG/0.5ML SOLN Inject into the skin. " Takes every 3 months"    [provider]      VITAL SIGNS:  Blood pressure 134/81, pulse 68, temperature 98.5 F (36.9 C), temperature source Oral, resp. rate 18, SpO2 100 %.  PHYSICAL EXAMINATION:  Physical Exam  GENERAL:  51 y.o.-year-old Caucasian male patient lying in the bed with no acute distress.  EYES: Pupils equal, round, reactive to light and accommodation. No scleral icterus. Extraocular muscles intact.  HEENT: Head atraumatic, normocephalic. Oropharynx and  nasopharynx clear.  NECK:  Supple, no jugular venous distention. No thyroid enlargement, no tenderness.  LUNGS: Normal breath sounds bilaterally, no wheezing, rales,rhonchi or crepitation. No use of accessory muscles of respiration.  CARDIOVASCULAR: Regular rate and rhythm, S1, S2 normal. No murmurs, rubs, or gallops.  ABDOMEN: Soft, nondistended, nontender. Bowel sounds present. No organomegaly or mass.  EXTREMITIES: No pedal edema, cyanosis, or clubbing.  NEUROLOGIC: Cranial nerves II through XII are intact. Muscle strength 5/5 in all extremities. Sensation intact. Gait not checked.  PSYCHIATRIC: The patient is alert and oriented x 3.  Normal affect and good eye contact. SKIN: Right middle finger swelling with erythema and tenderness with mild wound drainage with no significant fluctuation involving the distal phalanx and part of the middle phalanx with decreased range of motion that has improved with IV antibiotics in the ER.    LABORATORY PANEL:   CBC Recent Labs  Lab 04/27/22 2059  WBC 10.3  HGB 13.4  HCT 40.0  PLT 251   ------------------------------------------------------------------------------------------------------------------  Chemistries  Recent Labs  Lab 04/27/22 2059  NA 139  K 4.2  CL 107  CO2 24  GLUCOSE 118*  BUN 22*  CREATININE 0.98  CALCIUM 9.2   ------------------------------------------------------------------------------------------------------------------  Cardiac Enzymes No results for input(s): "TROPONINI" in the last 168 hours. ------------------------------------------------------------------------------------------------------------------  RADIOLOGY:  DG Hand Complete Right  Result Date: 04/27/2022 CLINICAL DATA:  Fourth finger infection. EXAM: RIGHT HAND - COMPLETE 3+ VIEW COMPARISON:  Radiograph dated 04/25/2022. FINDINGS: There is no acute fracture or dislocation. The bones are well mineralized. No arthritic changes. No bone erosion or  periosteal elevation. Diffuse soft tissue swelling of the dorsum of the hand as well as soft tissue swelling of the fourth digit. No opaque foreign object/gas. IMPRESSION: 1. No acute osseous pathology. 2. Soft tissue swelling may represent cellulitis. Clinical correlation is recommended. Electronically Signed   By: Elgie Collard M.D.   On: 04/27/2022 21:13      IMPRESSION AND PLAN:  Assessment and Plan: * Abscess of right middle finger - The patient be admitted to a medical bed. - We will continue antibiotic therapy with IV Rocephin and vancomycin. - Warm compresses will be applied - Pain management to be provided. - Orthopedic consult will be obtained. - Dr. Okey Dupre was notified and is aware about the patient.  Type 2 diabetes mellitus without complications (HCC) - We will place the patient on supplement coverage with NovoLog. - We will hold off metformin.  Psoriasis - Patient is on Stelara injections every 3 months.   DVT prophylaxis: Lovenox. Advanced Care Planning:  Code Status: full code. Family Communication:  The plan of care was discussed in details with the patient (and family). I answered all questions. The patient agreed to proceed with the above mentioned plan. Further management will depend upon hospital course. Disposition Plan: Back to previous home environment Consults called: Orthopedic consult All the records are reviewed and case discussed with ED provider.  Status is:  Observation  I certify that at the time of admission, it is my clinical judgment that the patient will require  hospital care extending last than 2 midnights.                            Dispo: The patient is from: Home              Anticipated d/c is to: Home              Patient currently is not medically stable to d/c.              Difficult to place patient: No  Hannah Beat M.D on 04/28/2022 at 12:26 AM  [facility]   From 7 PM-7 AM, contact night-coverage [email or web address]  CC:  Primary care physician; Ermalinda Memos, MD

## 2022-04-27 NOTE — ED Provider Triage Note (Signed)
Emergency Medicine Provider Triage Evaluation Note  Preston Crawford , a 51 y.o. male  was evaluated in triage.  Pt complains of right 4th finger infection. Saturday night he reports that he had a small opening on the top of his finger and the swelling and redness has worsened. Pain more on dorsum of hand rather than palmar aspect. Been on bactrim, keflex, and mupirocin.  Review of Systems  Positive: Finger pain and swelling  Negative: fever  Physical Exam  There were no vitals taken for this visit. Gen:   Awake, no distress   Resp:  Normal effort  MSK:   Moves extremities without difficulty  Other:  4th finger swelling to the proximal phalanx with small abrasion to dorsum, and swelling without erythema to dorsum of hand. Minimal TTP to palmar service. Mild pain with passive extension.   Medical Decision Making  Medically screening exam initiated at 8:50 PM.  Appropriate orders placed.  Clair Alfieri was informed that the remainder of the evaluation will be completed by another provider, this initial triage assessment does not replace that evaluation, and the importance of remaining in the ED until their evaluation is complete.     Jackelyn Hoehn, PA-C 04/27/22 2056

## 2022-04-27 NOTE — ED Provider Notes (Signed)
Renaldo Fiddler    CSN: [id number] Arrival date & time: 04/27/22  0901      History   Chief Complaint Chief Complaint  Patient presents with   Follow-up    HPI Delane Wessinger is a 51 y.o. male.   Patient presents for reevaluation of a right ring finger infection.  Endorses that symptoms have worsened with increased pain, swelling and erythema.  Pain is rated as 7 out of 10, described as a constant soreness.  Limited range of motion, unable to fully flex finger..   Was evaluated in urgent care 2 days ago and has been started on back Bactrim.  Denies fever or drainage, numbness or tingling.   Past Medical History:  Diagnosis Date   Prediabetes    Psoriasis     There are no problems to display for this patient.   Past Surgical History:  Procedure Laterality Date   TONSILECTOMY, ADENOIDECTOMY, BILATERAL MYRINGOTOMY AND TUBES         Home Medications    Prior to Admission medications   Medication Sig Start Date End Date Taking? Authorizing Provider  cephALEXin (KEFLEX) 500 MG capsule Take 1 capsule (500 mg total) by mouth 2 (two) times daily for 5 days. 04/27/22 05/02/22 Yes Beya Tipps R, NP  metFORMIN (GLUCOPHAGE-XR) 500 MG 24 hr tablet Take 500 mg by mouth daily. 04/01/22  Yes [provider]  mupirocin ointment (BACTROBAN) 2 % Apply 1 Application topically 2 (two) times daily. 04/25/22  Yes Raspet, Erin K, PA-C  oxyCODONE (ROXICODONE) 5 MG immediate release tablet Take 1 tablet (5 mg total) by mouth every 6 (six) hours as needed for up to 5 days for severe pain. 04/27/22 05/02/22 Yes Decklin Weddington R, NP  sulfamethoxazole-trimethoprim (BACTRIM DS) 800-160 MG tablet Take 1 tablet by mouth 2 (two) times daily for 7 days. 04/25/22 05/02/22 Yes Raspet, Erin K, PA-C  Ustekinumab (STELARA) 45 MG/0.5ML SOLN Inject into the skin. " Takes every 3 months"    [provider]    Family History Family History  Problem Relation Age of Onset   Prostate cancer Neg Hx     Bladder Cancer Neg Hx    Kidney cancer Neg Hx     Social History Social History   Tobacco Use   Smoking status: Never   Smokeless tobacco: Never  Vaping Use   Vaping Use: Never used  Substance Use Topics   Alcohol use: Yes   Drug use: No     Allergies   Patient has no known allergies.   Review of Systems Review of Systems  Constitutional: Negative.   Respiratory: Negative.    Cardiovascular: Negative.   Skin:  Positive for wound. Negative for color change, pallor and rash.  Neurological: Negative.      Physical Exam Triage Vital Signs ED Triage Vitals  Enc Vitals Group     BP 04/27/22 0907 129/83     Pulse Rate 04/27/22 0907 66     Resp 04/27/22 0907 12     Temp 04/27/22 0907 97.9 F (36.6 C)     Temp Source 04/27/22 0907 Oral     SpO2 04/27/22 0907 96 %     Weight --      Height --      Head Circumference --      Peak Flow --      Pain Score 04/27/22 0909 7     Pain Loc --      Pain Edu? --  Excl. in [place]? --    No data found.  Updated Vital Signs BP 129/83 (BP Location: Left Arm)   Pulse 66   Temp 97.9 F (36.6 C) (Oral)   Resp 12   SpO2 96%   Visual Acuity Right Eye Distance:   Left Eye Distance:   Bilateral Distance:    Right Eye Near:   Left Eye Near:    Bilateral Near:     Physical Exam Constitutional:      Appearance: Normal appearance.  HENT:     Head: Normocephalic.  Eyes:     Extraocular Movements: Extraocular movements intact.  Pulmonary:     Effort: Pulmonary effort is normal.  Skin:    Comments: Moderate to severe swelling with erythema and tenderness noted to the proximal phalanx of the right ring finger, unable to flex, sensation intact, capillary refill less than 3, less than 0.5 cm abrasion noted to this center of the proximal phalanx joint, able to expel scant serosanguineous drainage from the abrasion  Neurological:     Mental Status: He is alert and oriented to person, place, and time. Mental status is at  baseline.  Psychiatric:        Mood and Affect: Mood normal.        Behavior: Behavior normal.      UC Treatments / Results  Labs (all labs ordered are listed, but only abnormal results are displayed) Labs Reviewed - No data to display  EKG   Radiology DG Finger Ring Right  Result Date: 04/25/2022 CLINICAL DATA:  Ring finger pain and swelling for 2 days. No known injury. EXAM: RIGHT RING FINGER 2+V COMPARISON:  None Available. FINDINGS: There is no evidence of fracture or dislocation. There is no evidence of arthropathy or other focal bone abnormality. Proximal soft tissue swelling is seen, without evidence of soft tissue gas or radiopaque foreign body. IMPRESSION: Proximal soft tissue swelling. No osseous abnormality. Electronically Signed   By: Danae Orleans M.D.   On: 04/25/2022 12:07    Procedures Procedures (including critical care time)  Medications Ordered in UC Medications - No data to display  Initial Impression / Assessment and Plan / UC Course  I have reviewed the triage vital signs and the nursing notes.  Pertinent labs & imaging results that were available during my care of the patient were reviewed by me and considered in my medical decision making (see chart for details).  Abrasion of right ring finger with infection  Significant swelling and erythema noted on exam, x-ray finger completed 2 days ago without signs of osteomyelitis or joint involvement, lab work completed 2 days ago stable, as patient is only taking 2 days of medication, advised to continue Bactrim, will add additional bacterial coverage, Keflex prescribed as well as oxycodone for management of pain, recommend ice and heat over the affected area, given strict precautions that if no improvement seen in the next 48 hours he is to go to the nearest emergency department for evaluation of intravenous antibiotics Final Clinical Impressions(s) / UC Diagnoses   Final diagnoses:  Abrasion of right ring finger  with infection     Discharge Instructions      Continue use of Bactrim as prescribed  Begin use of Keflex every morning and every evening days for additional bacterial coverage  You may take Tylenol and ibuprofen as needed for pain, may use oxycodone every 6 hours as needed if ineffective, be mindful this medication may make you drowsy  May place ice  or heat over the affected areas in 10 to 15-minute intervals to help with comfort and swelling  If You do not see improvement within the next 48 hours please go to the nearest emergency department for evaluation for IV medications   ED Prescriptions     Medication Sig Dispense Auth. Provider   cephALEXin (KEFLEX) 500 MG capsule Take 1 capsule (500 mg total) by mouth 2 (two) times daily for 5 days. 10 capsule Auria Mckinlay R, NP   oxyCODONE (ROXICODONE) 5 MG immediate release tablet Take 1 tablet (5 mg total) by mouth every 6 (six) hours as needed for up to 5 days for severe pain. 20 tablet Krystalynn Ridgeway, Elita Boone, NP      I have reviewed the PDMP during this encounter.   [name], [place] 04/27/22 [phone]

## 2022-04-27 NOTE — ED Triage Notes (Addendum)
Patient presents for follow up on finger infection.   Patient was seen at [facility] initially and diagnosed with finger infection per patient statement.   Patient endorses worsening symptoms with increased swelling and redness to RT ring finger.   Patient denies drainage to site.   Patient has taken Bactrim for 2 days with no improvement of symptoms per patient statement.

## 2022-04-27 NOTE — ED Triage Notes (Signed)
Pt presents to ER with c/o swelling and wound to right ring finger that started on Saturday last week.  Pt states he has been been seen at [facility] and has been on bactrim, keflex and has been given some mupirocin ointment to apply to the finger.  Pt states there appeared to be a small pustule on the top if his finger, but has since gone away.  Pt has significant redness and swelling to right ring finger and increased swelling to his right hand.  Pt otherwise A&O x4 at this time in NAD.

## 2022-04-27 NOTE — ED Provider Notes (Signed)
[facility] Provider Note    Event Date/Time   First MD Initiated Contact with Patient 04/27/22 2126     (approximate)   History   Chief Complaint Hand Pain   HPI  Preston Crawford is a 51 y.o. male with past medical history of diabetes who presents to the ED complaining of hand pain.  Patient reports that he initially developed redness and swelling over the dorsum of his proximal right ring finger 4 days ago.  He was seen at urgent care the next day and started on Bactrim, which he has now taken for 2 days.  He has not had any improvement in his symptoms and was seen at urgent care earlier today, at which point he was started on Keflex in addition to this.  Swelling to his finger has been increasing since then, wrapping around to the ventral surface of the finger with increased redness and swelling extending into the dorsum of his hand.  He denies any fevers, has been able to flex and extend his ring finger but with some discomfort.  He has started to noticed a small wound over the dorsum of the finger that has been draining pus.  He denies any specific injuries to the finger, does report spending time at the beach and in the ocean 1 week prior to the onset of symptoms.      Physical Exam   Triage Vital Signs: ED Triage Vitals  Enc Vitals Group     BP 04/27/22 2053 134/81     Pulse Rate 04/27/22 2053 68     Resp 04/27/22 2053 18     Temp 04/27/22 2053 98.5 F (36.9 C)     Temp Source 04/27/22 2053 Oral     SpO2 04/27/22 2053 100 %     Weight --      Height --      Head Circumference --      Peak Flow --      Pain Score 04/27/22 2056 6     Pain Loc --      Pain Edu? --      Excl. in [place]? --     Most recent vital signs: Vitals:   04/27/22 2053  BP: 134/81  Pulse: 68  Resp: 18  Temp: 98.5 F (36.9 C)  SpO2: 100%    Constitutional: Alert and oriented. Eyes: Conjunctivae are normal. Head: Atraumatic. Nose: No  congestion/rhinnorhea. Mouth/Throat: Mucous membranes are moist.  Cardiovascular: Normal rate, regular rhythm. Grossly normal heart sounds.  2+ radial pulses bilaterally. Respiratory: Normal respiratory effort.  No retractions. Lungs CTAB. Gastrointestinal: Soft and nontender. No distention. Musculoskeletal: No lower extremity tenderness nor edema.  Erythema and edema circumferentially to the proximal portion of right ring finger, more prominent dorsally with small wound draining pus.  See picture below. Neurologic:  Normal speech and language. No gross focal neurologic deficits are appreciated.         ED Results / Procedures / Treatments   Labs (all labs ordered are listed, but only abnormal results are displayed) Labs Reviewed  BASIC METABOLIC PANEL - Abnormal; Notable for the following components:      Result Value   Glucose, Bld 118 (*)    BUN 22 (*)    All other components within normal limits  CBC WITH DIFFERENTIAL/PLATELET - Abnormal; Notable for the following components:   Neutro Abs 7.8 (*)    Monocytes Absolute 1.1 (*)    All other components within normal limits  SEDIMENTATION RATE  C-REACTIVE PROTEIN  BASIC METABOLIC PANEL  CBC  HIV ANTIBODY (ROUTINE TESTING W REFLEX)   RADIOLOGY Right hand XR reviewed and interpreted by me with soft tissue swelling, no fracture or dislocation.  PROCEDURES:  Critical Care performed: No  Procedures   MEDICATIONS ORDERED IN ED: Medications  doxycycline (VIBRAMYCIN) 100 mg in sodium chloride 0.9 % 250 mL IVPB (has no administration in time range)  oxyCODONE (Oxy IR/ROXICODONE) immediate release tablet 5 mg (has no administration in time range)  metFORMIN (GLUCOPHAGE-XR) 24 hr tablet 500 mg (has no administration in time range)  mupirocin ointment (BACTROBAN) 2 % 1 Application (has no administration in time range)  enoxaparin (LOVENOX) injection 40 mg (has no administration in time range)  0.9 %  sodium chloride infusion  (has no administration in time range)  acetaminophen (TYLENOL) tablet 650 mg (has no administration in time range)    Or  acetaminophen (TYLENOL) suppository 650 mg (has no administration in time range)  traZODone (DESYREL) tablet 25 mg (has no administration in time range)  magnesium hydroxide (MILK OF MAGNESIA) suspension 30 mL (has no administration in time range)  ondansetron (ZOFRAN) tablet 4 mg (has no administration in time range)    Or  ondansetron (ZOFRAN) injection 4 mg (has no administration in time range)  cefTRIAXone (ROCEPHIN) 1 g in sodium chloride 0.9 % 100 mL IVPB (has no administration in time range)  oxyCODONE-acetaminophen (PERCOCET/ROXICET) 5-325 MG per tablet 1 tablet (has no administration in time range)  vancomycin (VANCOCIN) IVPB 1000 mg/200 mL premix (1,000 mg Intravenous New Bag/Given 04/27/22 2241)  cefTRIAXone (ROCEPHIN) 1 g in sodium chloride 0.9 % 100 mL IVPB (0 g Intravenous Stopped 04/27/22 2241)     IMPRESSION / MDM / ASSESSMENT AND PLAN / ED COURSE  I reviewed the triage vital signs and the nursing notes.                              51 y.o. male with past medical history of diabetes who presents to the ED with increasing pain and swelling to his proximal right ring finger for the past 4 days.  Patient's presentation is most consistent with acute presentation with potential threat to life or bodily function.  Differential diagnosis includes, but is not limited to, cellulitis, abscess, flexor tenosynovitis, vibrio infection.  Patient nontoxic-appearing and in no acute distress, vital signs are reassuring and do not appear consistent with sepsis.  He does appear to have cellulitis circumferentially to the proximal portion of his right ring finger with likely abscess over the dorsal portion and given wound draining pus.  Flexor tenosynovitis seems less likely given the dorsal portion of his finger is greatest area of infection with patient able to fully extend  his finger with relatively mild discomfort.  Labs are reassuring with no significant anemia, leukocytosis, electrolyte abnormality, or AKI.  ESR without significant elevation.  X-ray is also unremarkable.  Case discussed with Dr. Sidney Ace of hospitalist service for admission, who request that I speak with orthopedics here at [facility] to ensure they are comfortable managing this hand infection.  Case discussed with Dr. Sharlet Salina of orthopedics, who states he does not feel comfortable managing hand infection given patient will likely require incision and drainage.  I spoke with [facility] transfer center per patient preference, and they are currently at capacity.  I spoke with [facility] transfer center, again given patient preference, and discussed case with Dr. Delfino Lovett of  hand surgery.  He feels that transfer to higher level of care would be inappropriate given infection seems primarily dorsal and recommends drainage by local orthopedic team.  Dr. Sidney Ace of hospitalist service is agreeable to admission so that patient may be evaluated in person by orthopedic team.      FINAL CLINICAL IMPRESSION(S) / ED DIAGNOSES   Final diagnoses:  Cellulitis and abscess of hand     Rx / DC Orders   ED Discharge Orders     None        Note:  This document was prepared using Dragon voice recognition software and may include unintentional dictation errors.   Blake Divine, MD 04/27/22 2351

## 2022-04-27 NOTE — ED Notes (Signed)
Called [facility]) for transfer

## 2022-04-28 ENCOUNTER — Other Ambulatory Visit: Payer: Self-pay

## 2022-04-28 ENCOUNTER — Inpatient Hospital Stay: Payer: No Typology Code available for payment source | Admitting: Anesthesiology

## 2022-04-28 ENCOUNTER — Encounter: Payer: Self-pay | Admitting: Internal Medicine

## 2022-04-28 ENCOUNTER — Encounter
Admission: EM | Disposition: A | Discharge status: Home / Self Care | Payer: Self-pay | Source: Home / Self Care | Attending: Internal Medicine

## 2022-04-28 DIAGNOSIS — Z7984 Long term (current) use of oral hypoglycemic drugs: Secondary | ICD-10-CM | POA: Diagnosis not present

## 2022-04-28 DIAGNOSIS — L02511 Cutaneous abscess of right hand: Secondary | ICD-10-CM | POA: Diagnosis present

## 2022-04-28 DIAGNOSIS — G312 Degeneration of nervous system due to alcohol: Secondary | ICD-10-CM | POA: Diagnosis present

## 2022-04-28 DIAGNOSIS — M7989 Other specified soft tissue disorders: Secondary | ICD-10-CM | POA: Diagnosis present

## 2022-04-28 DIAGNOSIS — F1090 Alcohol use, unspecified, uncomplicated: Secondary | ICD-10-CM | POA: Diagnosis present

## 2022-04-28 DIAGNOSIS — E119 Type 2 diabetes mellitus without complications: Secondary | ICD-10-CM | POA: Diagnosis present

## 2022-04-28 DIAGNOSIS — L03011 Cellulitis of right finger: Secondary | ICD-10-CM | POA: Diagnosis present

## 2022-04-28 DIAGNOSIS — L409 Psoriasis, unspecified: Secondary | ICD-10-CM

## 2022-04-28 DIAGNOSIS — Z79899 Other long term (current) drug therapy: Secondary | ICD-10-CM | POA: Diagnosis not present

## 2022-04-28 DIAGNOSIS — B9562 Methicillin resistant Staphylococcus aureus infection as the cause of diseases classified elsewhere: Secondary | ICD-10-CM | POA: Diagnosis present

## 2022-04-28 HISTORY — PX: INCISION AND DRAINAGE: SHX5863

## 2022-04-28 LAB — CBC
HCT: 36 % — ABNORMAL LOW (ref 39.0–52.0)
Hemoglobin: 12 g/dL — ABNORMAL LOW (ref 13.0–17.0)
MCH: 29.7 pg (ref 26.0–34.0)
MCHC: 33.3 g/dL (ref 30.0–36.0)
MCV: 89.1 fL (ref 80.0–100.0)
Platelets: 212 10*3/uL (ref 150–400)
RBC: 4.04 MIL/uL — ABNORMAL LOW (ref 4.22–5.81)
RDW: 13.2 % (ref 11.5–15.5)
WBC: 9.4 10*3/uL (ref 4.0–10.5)
nRBC: 0 % (ref 0.0–0.2)

## 2022-04-28 LAB — BASIC METABOLIC PANEL
Anion gap: 5 (ref 5–15)
BUN: 13 mg/dL (ref 6–20)
CO2: 20 mmol/L — ABNORMAL LOW (ref 22–32)
Calcium: 8.3 mg/dL — ABNORMAL LOW (ref 8.9–10.3)
Chloride: 112 mmol/L — ABNORMAL HIGH (ref 98–111)
Creatinine, Ser: 0.78 mg/dL (ref 0.61–1.24)
GFR, Estimated: >60 mL/min (ref >60)
Glucose, Bld: 116 mg/dL — ABNORMAL HIGH (ref 70–99)
Potassium: 3.7 mmol/L (ref 3.5–5.1)
Sodium: 137 mmol/L (ref 135–145)

## 2022-04-28 LAB — C-REACTIVE PROTEIN: CRP: 4.6 mg/dL — ABNORMAL HIGH (ref <1.0)

## 2022-04-28 LAB — HIV ANTIBODY (ROUTINE TESTING W REFLEX): HIV Screen 4th Generation wRfx: NONREACTIVE

## 2022-04-28 SURGERY — INCISION AND DRAINAGE
Anesthesia: General | Site: Finger | Laterality: Right

## 2022-04-28 MED ORDER — DROPERIDOL 2.5 MG/ML IJ SOLN
0.6250 mg | Freq: Once | INTRAMUSCULAR | Status: DC | PRN
Start: 2022-04-28 — End: 2022-04-28

## 2022-04-28 MED ORDER — 0.9 % SODIUM CHLORIDE (POUR BTL) OPTIME
TOPICAL | Status: DC | PRN
  Administered 2022-04-28: 1000 mL

## 2022-04-28 MED ORDER — KETAMINE HCL 50 MG/5ML IJ SOSY
PREFILLED_SYRINGE | INTRAMUSCULAR | Status: AC
  Filled 2022-04-28: qty 5

## 2022-04-28 MED ORDER — MORPHINE SULFATE (PF) 2 MG/ML IV SOLN
2.0000 mg | INTRAVENOUS | Status: DC | PRN
  Administered 2022-04-28 (×2): 2 mg via INTRAVENOUS
  Filled 2022-04-28 (×2): qty 1

## 2022-04-28 MED ORDER — VANCOMYCIN HCL 1250 MG/250ML IV SOLN
1250.0000 mg | Freq: Two times a day (BID) | INTRAVENOUS | Status: DC
  Administered 2022-04-28 – 2022-04-29 (×4): 1250 mg via INTRAVENOUS
  Filled 2022-04-28 (×4): qty 250

## 2022-04-28 MED ORDER — FENTANYL CITRATE (PF) 100 MCG/2ML IJ SOLN: 25.0000 ug | INTRAMUSCULAR | Status: DC | PRN

## 2022-04-28 MED ORDER — KETOROLAC TROMETHAMINE 15 MG/ML IJ SOLN: 15.0000 mg | Freq: Once | INTRAMUSCULAR | Status: DC | PRN

## 2022-04-28 MED ORDER — SODIUM CHLORIDE 0.9 % IV SOLN: INTRAVENOUS | Status: DC | PRN

## 2022-04-28 MED ORDER — MIDAZOLAM HCL 2 MG/2ML IJ SOLN
INTRAMUSCULAR | Status: DC | PRN
  Administered 2022-04-28: 2 mg via INTRAVENOUS

## 2022-04-28 MED ORDER — OXYCODONE HCL 5 MG PO TABS
5.0000 mg | ORAL_TABLET | ORAL | Status: DC | PRN
  Administered 2022-04-28 – 2022-04-29 (×2): 5 mg via ORAL
  Filled 2022-04-28 (×2): qty 1

## 2022-04-28 MED ORDER — PROPOFOL 10 MG/ML IV BOLUS
INTRAVENOUS | Status: DC | PRN
  Administered 2022-04-28: 70 mg via INTRAVENOUS
  Administered 2022-04-28 (×2): 20 mg via INTRAVENOUS

## 2022-04-28 MED ORDER — LIDOCAINE HCL (PF) 1 % IJ SOLN
INTRAMUSCULAR | Status: AC
  Filled 2022-04-28: qty 30

## 2022-04-28 MED ORDER — MIDAZOLAM HCL 2 MG/2ML IJ SOLN
INTRAMUSCULAR | Status: AC
  Filled 2022-04-28: qty 2

## 2022-04-28 MED ORDER — PROMETHAZINE HCL 25 MG/ML IJ SOLN: 6.2500 mg | INTRAMUSCULAR | Status: DC | PRN

## 2022-04-28 MED ORDER — OXYCODONE HCL 5 MG PO TABS: 5.0000 mg | ORAL_TABLET | Freq: Once | ORAL | Status: DC | PRN

## 2022-04-28 MED ORDER — KETAMINE HCL 10 MG/ML IJ SOLN
INTRAMUSCULAR | Status: DC | PRN
  Administered 2022-04-28: 20 mg via INTRAVENOUS
  Administered 2022-04-28: 10 mg via INTRAVENOUS

## 2022-04-28 MED ORDER — OXYCODONE HCL 5 MG/5ML PO SOLN: 5.0000 mg | Freq: Once | ORAL | Status: DC | PRN

## 2022-04-28 MED ORDER — LIDOCAINE HCL (PF) 1 % IJ SOLN
INTRAMUSCULAR | Status: DC | PRN
  Administered 2022-04-28: 17 mL via INTRAMUSCULAR

## 2022-04-28 MED ORDER — ACETAMINOPHEN 10 MG/ML IV SOLN
1000.0000 mg | Freq: Once | INTRAVENOUS | Status: DC | PRN
Start: 2022-04-28 — End: 2022-04-28

## 2022-04-28 MED ORDER — BUPIVACAINE HCL (PF) 0.25 % IJ SOLN
INTRAMUSCULAR | Status: AC
  Filled 2022-04-28: qty 30

## 2022-04-28 SURGICAL SUPPLY — 31 items
BNDG COHESIVE 4X5 TAN STRL LF (GAUZE/BANDAGES/DRESSINGS) ×1 IMPLANT
BNDG ELASTIC 4X5.8 VLCR NS LF (GAUZE/BANDAGES/DRESSINGS) ×1 IMPLANT
BNDG GAUZE DERMACEA FLUFF 4 (GAUZE/BANDAGES/DRESSINGS) ×1 IMPLANT
CHLORAPREP W/TINT 26 (MISCELLANEOUS) ×2 IMPLANT
DRAPE 3/4 80X56 (DRAPES) IMPLANT
DRAPE INCISE IOBAN 66X60 STRL (DRAPES) ×1 IMPLANT
DRAPE SURG 17X11 SM STRL (DRAPES) IMPLANT
DRAPE U-SHAPE 47X51 STRL (DRAPES) IMPLANT
ELECT REM PT RETURN 9FT ADLT (ELECTROSURGICAL) ×1
ELECTRODE REM PT RTRN 9FT ADLT (ELECTROSURGICAL) ×1 IMPLANT
GAUZE PACKING 1/4 X5 YD (GAUZE/BANDAGES/DRESSINGS) IMPLANT
GAUZE SPONGE 4X4 12PLY STRL (GAUZE/BANDAGES/DRESSINGS) IMPLANT
GAUZE XEROFORM 1X8 LF (GAUZE/BANDAGES/DRESSINGS) IMPLANT
GLOVE BIO SURGEON STRL SZ7.5 (GLOVE) ×1 IMPLANT
GLOVE SURG UNDER POLY LF SZ7.5 (GLOVE) ×1 IMPLANT
GOWN STRL REUS W/ TWL XL LVL3 (GOWN DISPOSABLE) ×2 IMPLANT
GOWN STRL REUS W/TWL XL LVL3 (GOWN DISPOSABLE) ×2
IV NS IRRIG 3000ML ARTHROMATIC (IV SOLUTION) ×1 IMPLANT
KIT PREVENA INCISION MGT 13 (CANNISTER) IMPLANT
KIT TURNOVER KIT A (KITS) ×1 IMPLANT
MANIFOLD NEPTUNE II (INSTRUMENTS) ×1 IMPLANT
NS IRRIG 1000ML POUR BTL (IV SOLUTION) ×1 IMPLANT
PACK EXTREMITY ARMC (MISCELLANEOUS) ×1 IMPLANT
PAD CAST 4YDX4 CTTN HI CHSV (CAST SUPPLIES) ×1 IMPLANT
PADDING CAST COTTON 4X4 STRL (CAST SUPPLIES) ×1
STAPLER SKIN PROX 35W (STAPLE) ×1 IMPLANT
STOCKINETTE IMPERVIOUS 9X36 MD (GAUZE/BANDAGES/DRESSINGS) ×1 IMPLANT
SUT ETHILON 2 0 FS 18 (SUTURE) ×1 IMPLANT
TOWEL OR 17X26 4PK STRL BLUE (TOWEL DISPOSABLE) ×1 IMPLANT
TRAP FLUID SMOKE EVACUATOR (MISCELLANEOUS) ×1 IMPLANT
WATER STERILE IRR 500ML POUR (IV SOLUTION) ×1 IMPLANT

## 2022-04-28 NOTE — Assessment & Plan Note (Signed)
Hold metformin. Sliding scale Novolog. 

## 2022-04-28 NOTE — Progress Notes (Signed)
PROGRESS NOTE    Preston Crawford  RJJ:[id number] DOB: January 22, 1971 DOA: 04/27/2022 PCP: Ermalinda Memos, MD    Brief Narrative:  51 y.o. male with medical history significant for prediabetes and psoriasis, who presented to the emergency room with acute onset of right ring finger swelling and worsening pain and erythema.  He has been having diminished range of motion and today has not been able to fully flex his right ring finger.  He was evaluated a couple of days at an urgent care and was started on p.o. Bactrim.  Later on he was prescribed p.o. Keflex.  No fever or chills or drainage.  He denies any tingling or numbness.  Initially orthopedics was consulted by EDP at [facility].  Recommended transfer as he felt wound would likely need surgical incision and drainage.  Transfer to [facility] were attempted however no beds.  EDP did speak with hand surgery at [facility] who indicated this patient would not need transfer to higher level of care.  Orthopedics aware and will consult at [facility].  Patient is resting comfortably in bed.  He reports pain only in affected finger.  No systemic signs.  No fevers chills or diaphoresis.  Orthopedics Dr. Okey Dupre aware of patient and will take to operating room 9/7 for incision and drainage.  Assessment & Plan:   Principal Problem:   Abscess of right middle finger Active Problems:   Type 2 diabetes mellitus without complications (HCC)   Psoriasis  Right middle finger cellulitis with possible abscess Unclear triggering event No systemic signs Patient not septic or toxic appearing Plan: Continue broad-spectrum antibiotics for now IV fluids Pain control OR today with orthopedics   Type 2 diabetes mellitus without complications (HCC) Hold home metformin Initiate sliding scale Carb modified diet when able to take p.o.    Psoriasis Patient on Stelara injections every 3 months     DVT prophylaxis: SQ Lovenox Code Status: Full Family Communication:  None Disposition Plan: Status is: Observation The patient will require care spanning > 2 midnights and should be moved to inpatient because: Severe cellulitis of right middle finger on IV antibiotics.  Plan for surgical incision and drainage today.   Level of care: Med-Surg  Consultants:  Orthopedics-Crawford  Procedures:  Right finger I&D planned 9/7  Antimicrobials: Vancomycin Ceftriaxone   Subjective: Examined.  Resting comfortably in bed.  Only pain endorses in affected right middle finger.  No other complaints  Objective: Vitals:   04/27/22 2053 04/28/22 0100 04/28/22 0600 04/28/22 0800  BP: 134/81 128/80 134/84 (!) 106/50  Pulse: 68 72 60 (!) 56  Resp: 18 18 18 18   Temp: 98.5 F (36.9 C) 98.6 F (37 C) 98.4 F (36.9 C)   TempSrc: Oral     SpO2: 100% 100% 100% 100%    Intake/Output Summary (Last 24 hours) at 04/28/2022 1055 Last data filed at 04/28/2022 0401 Gross per 24 hour  Intake 450.11 ml  Output --  Net 450.11 ml   There were no vitals filed for this visit.  Examination:  General exam: Appears calm and comfortable  Respiratory system: Clear to auscultation. Respiratory effort normal. Cardiovascular system: S1-S2, RRR, no murmurs, no pedal edema Gastrointestinal system: Soft, NT/ND, normal bowel sounds Central nervous system: Alert and oriented. No focal neurological deficits. Extremities: Symmetric 5 x 5 power. Skin: Right middle finger swollen, erythematous, tender to touch Psychiatry: Judgement and insight appear normal. Mood & affect appropriate.     Data Reviewed: I have personally reviewed following labs and  imaging studies  CBC: Recent Labs  Lab 04/25/22 1134 04/27/22 2059 04/28/22 0526  WBC 7.9 10.3 9.4  NEUTROABS 5.6 7.8*  --   HGB 13.6 13.4 12.0*  HCT 40.3 40.0 36.0*  MCV 89.6 88.9 89.1  PLT 226 251 212   Basic Metabolic Panel: Recent Labs  Lab 04/25/22 1134 04/27/22 2059 04/28/22 0526  NA 137 139 137  K 4.3 4.2 3.7  CL  109 107 112*  CO2 24 24 20*  GLUCOSE 101* 118* 116*  BUN 19 22* 13  CREATININE 0.75 0.98 0.78  CALCIUM 8.9 9.2 8.3*   GFR: Estimated Creatinine Clearance: 115.3 mL/min (by C-G formula based on SCr of 0.78 mg/dL). Liver Function Tests: No results for input(s): "AST", "ALT", "ALKPHOS", "BILITOT", "PROT", "ALBUMIN" in the last 168 hours. No results for input(s): "LIPASE", "AMYLASE" in the last 168 hours. No results for input(s): "AMMONIA" in the last 168 hours. Coagulation Profile: No results for input(s): "INR", "PROTIME" in the last 168 hours. Cardiac Enzymes: No results for input(s): "CKTOTAL", "CKMB", "CKMBINDEX", "TROPONINI" in the last 168 hours. BNP (last 3 results) No results for input(s): "PROBNP" in the last 8760 hours. HbA1C: No results for input(s): "HGBA1C" in the last 72 hours. CBG: No results for input(s): "GLUCAP" in the last 168 hours. Lipid Profile: No results for input(s): "CHOL", "HDL", "LDLCALC", "TRIG", "CHOLHDL", "LDLDIRECT" in the last 72 hours. Thyroid Function Tests: No results for input(s): "TSH", "T4TOTAL", "FREET4", "T3FREE", "THYROIDAB" in the last 72 hours. Anemia Panel: No results for input(s): "VITAMINB12", "FOLATE", "FERRITIN", "TIBC", "IRON", "RETICCTPCT" in the last 72 hours. Sepsis Labs: No results for input(s): "PROCALCITON", "LATICACIDVEN" in the last 168 hours.  No results found for this or any previous visit (from the past 240 hour(s)).       Radiology Studies: DG Hand Complete Right  Result Date: 04/27/2022 CLINICAL DATA:  Fourth finger infection. EXAM: RIGHT HAND - COMPLETE 3+ VIEW COMPARISON:  Radiograph dated 04/25/2022. FINDINGS: There is no acute fracture or dislocation. The bones are well mineralized. No arthritic changes. No bone erosion or periosteal elevation. Diffuse soft tissue swelling of the dorsum of the hand as well as soft tissue swelling of the fourth digit. No opaque foreign object/gas. IMPRESSION: 1. No acute osseous  pathology. 2. Soft tissue swelling may represent cellulitis. Clinical correlation is recommended. Electronically Signed   By: Elgie Collard M.D.   On: 04/27/2022 21:13        Scheduled Meds:  enoxaparin (LOVENOX) injection  40 mg Subcutaneous Q24H   metFORMIN  500 mg Oral Q breakfast   mupirocin ointment  1 Application Topical BID   Continuous Infusions:  sodium chloride 75 mL/hr at 04/28/22 0100   cefTRIAXone (ROCEPHIN)  IV     vancomycin Stopped (04/28/22 0530)     LOS: 0 days   Tresa Glomb, MD [facility]   If 7PM-7AM, please contact night-coverage  04/28/2022, 10:55 AM

## 2022-04-28 NOTE — Transfer of Care (Addendum)
Immediate Anesthesia Transfer of Care Note  Patient: Preston Crawford  Procedure(s) Performed: INCISION AND DRAINAGE (Right)  Patient Location: PACU  Anesthesia Type:MAC  Level of Consciousness: sedated  Airway & Oxygen Therapy: Patient Spontanous Breathing  Post-op Assessment: Report given to RN and Post -op Vital signs reviewed and stable  Post vital signs: Reviewed and stable  Last Vitals:  Vitals Value Taken Time  BP    Temp    Pulse    Resp    SpO2      Last Pain:  Vitals:   04/28/22 1524  TempSrc: Oral  PainSc: 7          Complications: No notable events documented.

## 2022-04-28 NOTE — Consult Note (Addendum)
ORTHOPAEDIC CONSULTATION  REQUESTING PHYSICIAN: Tresa Vanloan, MD  Chief Complaint: Right ring finger infection  HPI: Preston Crawford is a 51 y.o. male who complains of pain, swelling and drainage from the right ring finger. The pain is sharp in character.  He states that he was on oral antibiotics for a few days from urgent care, however pain and swelling progressed.   Patient was admitted to the hospitalist service and started on IV antibiotics.  Orthopedics was consulted regarding further management.  Past Medical History:  Diagnosis Date   Prediabetes    Psoriasis    Past Surgical History:  Procedure Laterality Date   TONSILECTOMY, ADENOIDECTOMY, BILATERAL MYRINGOTOMY AND TUBES     Social History   Socioeconomic History   Marital status: Married    Spouse name: Not on file   Number of children: Not on file   Years of education: Not on file   Highest education level: Not on file  Occupational History   Not on file  Tobacco Use   Smoking status: Never   Smokeless tobacco: Never  Vaping Use   Vaping Use: Never used  Substance and Sexual Activity   Alcohol use: Yes   Drug use: No   Sexual activity: Not on file  Other Topics Concern   Not on file  Social History Narrative   Not on file   Social Determinants of Health   Financial Resource Strain: Not on file  Food Insecurity: No Food Insecurity (04/28/2022)   Hunger Vital Sign    Worried About Running Out of Food in the Last Year: Never true    Ran Out of Food in the Last Year: Never true  Transportation Needs: No Transportation Needs (04/28/2022)   PRAPARE - Administrator, Civil Service (Medical): No    Lack of Transportation (Non-Medical): No  Physical Activity: Not on file  Stress: Not on file  Social Connections: Not on file   Family History  Problem Relation Age of Onset   Prostate cancer Neg Hx    Bladder Cancer Neg Hx    Kidney cancer Neg Hx    No Known Allergies Prior to Admission  medications   Medication Sig Start Date End Date Taking? Authorizing Provider  cephALEXin (KEFLEX) 500 MG capsule Take 1 capsule (500 mg total) by mouth 2 (two) times daily for 5 days. 04/27/22 05/02/22 Yes White, Adrienne R, NP  metFORMIN (GLUCOPHAGE-XR) 500 MG 24 hr tablet Take 500 mg by mouth daily. 04/01/22  Yes [provider]  Multiple Vitamins-Minerals (MULTIVITAMIN WITH MINERALS) tablet Take 1 tablet by mouth daily.   Yes [provider]  sulfamethoxazole-trimethoprim (BACTRIM DS) 800-160 MG tablet Take 1 tablet by mouth 2 (two) times daily for 7 days. 04/25/22 05/02/22 Yes Raspet, Noberto Retort, PA-C  mupirocin ointment (BACTROBAN) 2 % Apply 1 Application topically 2 (two) times daily. 04/25/22   Raspet, Noberto Retort, PA-C  oxyCODONE (ROXICODONE) 5 MG immediate release tablet Take 1 tablet (5 mg total) by mouth every 6 (six) hours as needed for up to 5 days for severe pain. Patient not taking: Reported on 04/28/2022 04/27/22 05/02/22  [name], NP  Ustekinumab [facility]) 45 MG/0.5ML SOLN Inject into the skin. " Takes every 3 months"    [provider]  valACYclovir (VALTREX) 1000 MG tablet Take 1 g by mouth in the morning, at noon, and at bedtime. As directed. Patient not taking: Reported on 04/28/2022 04/07/22   [provider]   DG Hand  Complete Right  Result Date: 04/27/2022 CLINICAL DATA:  Fourth finger infection. EXAM: RIGHT HAND - COMPLETE 3+ VIEW COMPARISON:  Radiograph dated 04/25/2022. FINDINGS: There is no acute fracture or dislocation. The bones are well mineralized. No arthritic changes. No bone erosion or periosteal elevation. Diffuse soft tissue swelling of the dorsum of the hand as well as soft tissue swelling of the fourth digit. No opaque foreign object/gas. IMPRESSION: 1. No acute osseous pathology. 2. Soft tissue swelling may represent cellulitis. Clinical correlation is recommended. Electronically Signed   By: Elgie Collard M.D.   On: 04/27/2022 21:13     Positive ROS: All other systems have been reviewed and were otherwise negative with the exception of those mentioned in the HPI and as above.  Physical Exam: General: Alert, no acute distress Cardiovascular: No pedal edema Respiratory: No cyanosis, no use of accessory musculature GI: No organomegaly, abdomen is soft and non-tender Skin: No lesions in the area of chief complaint Neurologic: Sensation intact distally Psychiatric: Patient is competent for consent with normal mood and affect Lymphatic: No axillary or cervical lymphadenopathy  MUSCULOSKELETAL:   Right ring finger -dorsum of P1/P2 with swelling, induration, erythema, small amount of drainage overlying PIP joint, no tenderness along the flexor tendon sheath able to flex and extend the finger appropriately, neurovascular intact See pictures from hospitalist H&P  Assessment: 51 year old right-hand-dominant male with prediabetes admitted with swelling, erythema, drainage from the dorsal aspect of the right ring finger  Plan: I had a discussion with the patient regarding treatment options both operative and nonoperative.  Given appearance of the finger, there is likely an underlying abscess which we discussed would best be treated with incision and drainage.  Patient is in understanding and agreement with the plan.  Informed consent provided for right long finger I&D.  We discussed he will likely be discharged home on oral antibiotics in the next 1 to 2 days.    Ross Marcus, MD    04/28/2022 5:26 PM

## 2022-04-28 NOTE — Discharge Instructions (Addendum)
Orthopedic discharge instructions: Patient has had right ring finger incision and drainage Recommend elevation of the hand above the level of the heart to reduce swelling for the next 5 to 7 days Remove wound packing on post-op day 2-3. Twice daily soapy soaks starting postop day #1 Oral antibiotics x 7 to 10 days Follow up with Dr. Joie Bimler PA, Murilo Zanette PA-C at [facility], [place] office in 10-14 days for wound check, staple removal and x-ray.

## 2022-04-28 NOTE — Assessment & Plan Note (Signed)
-   The patient be admitted to a medical bed. - We will continue antibiotic therapy with IV Rocephin and vancomycin. - Warm compresses will be applied - Pain management to be provided. - Orthopedic consult will be obtained. - Dr. Okey Dupre was notified and is aware about the patient.

## 2022-04-28 NOTE — Progress Notes (Signed)
Pharmacy Antibiotic Note  Preston Crawford is a 51 y.o. male admitted on 04/27/2022 with cellulitis.  Pharmacy has been consulted for Vancomycin dosing.  Plan: Pt given Vancomycin 1000 mg once. Vancomycin 1250 mg IV Q 12 hrs. Goal AUC 400-550. Expected AUC: 533.6 SCr used: 0.98  Pharmacy will continue to follow and will adjust abx dosing whenever warranted.  Temp (24hrs), Avg:98.2 F (36.8 C), Min:97.9 F (36.6 C), Max:98.5 F (36.9 C)   Recent Labs  Lab 04/25/22 1134 04/27/22 2059  WBC 7.9 10.3  CREATININE 0.75 0.98    Estimated Creatinine Clearance: 94.2 mL/min (by C-G formula based on SCr of 0.98 mg/dL).    No Known Allergies  Antimicrobials this admission: 9/6 Ceftriaxone >> x 7 day 9/6 Vancomycin >>  9/7 Doxycycline >> x 1 dose  Microbiology results: No lab cultures currently ordered or pending at this time.  Thank you for allowing pharmacy to be a part of this patient's care.  Otelia Sergeant, PharmD, [facility] 04/28/2022 12:39 AM

## 2022-04-28 NOTE — Op Note (Signed)
DATE OF SURGERY:  04/28/2022  TIME: 6:16 PM  PATIENT NAME:  Preston Crawford  AGE: 51 y.o.  PRE-OPERATIVE DIAGNOSIS:  Right Ring finger infection-abscess  POST-OPERATIVE DIAGNOSIS:  SAME  PROCEDURE:  INCISION AND DRAINAGE RIGHT RING FINGER  SURGEON:  Ross Marcus  EBL:  10 cc  COMPLICATIONS: None  PREOPERATIVE INDICATIONS:  [name] is a 51 y.o. year old who was admitted with swelling, erythema and drainage from the right ring finger.  The risks benefits and alternatives were discussed with the patient including but not limited to the risks of nonoperative treatment, versus surgical intervention including infection, bleeding, nerve injury, cardiopulmonary complications, morbidity, mortality, among others, and they were willing to proceed.    OPERATIVE PROCEDURE:  The patient was brought to the operating room and placed in the supine position.  Light sedation was administered.  Sterile prep was performed.  Timeout was performed.  Preoperative antibiotics were administered.  A digital block was performed with 1% lidocaine core percent bupivacaine.  2 cm incision was made on the dorsal radial aspect of the proximal ring finger overlying the area of fluctuance.  There was notable purulence which was expressed.  This was cultured.  Hemostat was utilized to further open the underlying tissues to ensure that the entirety of the abscess was removed/opened.  Thorough irrigation was performed through the incision.  Quarter-inch iodoform was then packed into the wound.  Sterile dressing was applied.  The patient was awakened and returned to PACU in stable and satisfactory condition. There no complications and the patient tolerated the procedure well.   POSTOPERATIVE PLAN: Patient will be admitted overnight.  We will follow-up cultures.  Patient can likely be transition to oral antibiotics and discharged home tomorrow.  Recommend continued elevation of the hand and removal of the iodoform in  the next 2 to 3 days followed by twice daily soapy soaks.  Ross Marcus

## 2022-04-28 NOTE — Assessment & Plan Note (Signed)
-   Patient is on Stelara injections every 3 months.

## 2022-04-28 NOTE — Anesthesia Preprocedure Evaluation (Addendum)
Anesthesia Evaluation  Patient identified by MRN, date of birth, ID band Patient awake    Reviewed: Allergy & Precautions, NPO status , Patient's Chart, lab work & pertinent test results  Airway Mallampati: III  TM Distance: >3 FB Neck ROM: full    Dental no notable dental hx.    Pulmonary neg pulmonary ROS,    Pulmonary exam normal        Cardiovascular negative cardio ROS Normal cardiovascular exam     Neuro/Psych negative neurological ROS  negative psych ROS   GI/Hepatic negative GI ROS, Neg liver ROS,   Endo/Other  Pre-diabetes  Renal/GU negative Renal ROS  negative genitourinary   Musculoskeletal   Abdominal Normal abdominal exam  (+)   Peds  Hematology negative hematology ROS (+)   Anesthesia Other Findings Abscess of right middle finger  Past Medical History: No date: Prediabetes No date: Psoriasis  Past Surgical History: No date: TONSILECTOMY, ADENOIDECTOMY, BILATERAL MYRINGOTOMY AND TUBES     Reproductive/Obstetrics negative OB ROS                            Anesthesia Physical Anesthesia Plan  ASA: 2  Anesthesia Plan: General   Post-op Pain Management: Regional block*, Toradol IV (intra-op)* and Ofirmev IV (intra-op)*   Induction: Intravenous  PONV Risk Score and Plan: Propofol infusion and TIVA  Airway Management Planned: Natural Airway  Additional Equipment:   Intra-op Plan:   Post-operative Plan:   Informed Consent: I have reviewed the patients History and Physical, chart, labs and discussed the procedure including the risks, benefits and alternatives for the proposed anesthesia with the patient or authorized representative who has indicated his/her understanding and acceptance.     Dental Advisory Given  Plan Discussed with: Anesthesiologist, CRNA and Surgeon  Anesthesia Plan Comments:        Anesthesia Quick Evaluation

## 2022-04-29 ENCOUNTER — Encounter: Payer: Self-pay | Admitting: Orthopaedic Surgery

## 2022-04-29 ENCOUNTER — Other Ambulatory Visit (HOSPITAL_COMMUNITY): Payer: Self-pay

## 2022-04-29 DIAGNOSIS — L02511 Cutaneous abscess of right hand: Secondary | ICD-10-CM | POA: Diagnosis not present

## 2022-04-29 LAB — GLUCOSE, CAPILLARY
Glucose-Capillary: 112 mg/dL — ABNORMAL HIGH (ref 70–99)
Glucose-Capillary: 111 mg/dL — ABNORMAL HIGH (ref 70–99)

## 2022-04-29 LAB — CBC WITH DIFFERENTIAL/PLATELET
Abs Immature Granulocytes: 0.04 10*3/uL (ref 0.00–0.07)
Basophils Absolute: 0 10*3/uL (ref 0.0–0.1)
Basophils Relative: 0 %
Eosinophils Absolute: 0.1 10*3/uL (ref 0.0–0.5)
Eosinophils Relative: 1 %
HCT: 36.2 % — ABNORMAL LOW (ref 39.0–52.0)
Hemoglobin: 12.3 g/dL — ABNORMAL LOW (ref 13.0–17.0)
Immature Granulocytes: 0 %
Lymphocytes Relative: 17 %
Lymphs Abs: 1.7 10*3/uL (ref 0.7–4.0)
MCH: 29.6 pg (ref 26.0–34.0)
MCHC: 34 g/dL (ref 30.0–36.0)
MCV: 87 fL (ref 80.0–100.0)
Monocytes Absolute: 1.2 10*3/uL — ABNORMAL HIGH (ref 0.1–1.0)
Monocytes Relative: 12 %
Neutro Abs: 6.7 10*3/uL (ref 1.7–7.7)
Neutrophils Relative %: 70 %
Platelets: 233 10*3/uL (ref 150–400)
RBC: 4.16 MIL/uL — ABNORMAL LOW (ref 4.22–5.81)
RDW: 13 % (ref 11.5–15.5)
WBC: 9.7 10*3/uL (ref 4.0–10.5)
nRBC: 0 % (ref 0.0–0.2)

## 2022-04-29 LAB — BASIC METABOLIC PANEL
Anion gap: 8 (ref 5–15)
BUN: 10 mg/dL (ref 6–20)
CO2: 21 mmol/L — ABNORMAL LOW (ref 22–32)
Calcium: 8.6 mg/dL — ABNORMAL LOW (ref 8.9–10.3)
Chloride: 106 mmol/L (ref 98–111)
Creatinine, Ser: 0.74 mg/dL (ref 0.61–1.24)
GFR, Estimated: >60 mL/min (ref >60)
Glucose, Bld: 106 mg/dL — ABNORMAL HIGH (ref 70–99)
Potassium: 3.8 mmol/L (ref 3.5–5.1)
Sodium: 135 mmol/L (ref 135–145)

## 2022-04-29 LAB — HEMOGLOBIN A1C
Hgb A1c MFr Bld: 5.7 % — ABNORMAL HIGH (ref 4.8–5.6)
Mean Plasma Glucose: 116.89 mg/dL

## 2022-04-29 MED ORDER — LINEZOLID 600 MG PO TABS
600.0000 mg | ORAL_TABLET | Freq: Two times a day (BID) | ORAL | Status: DC
  Filled 2022-04-29: qty 1

## 2022-04-29 MED ORDER — INSULIN ASPART 100 UNIT/ML IJ SOLN: 0.0000 [IU] | Freq: Three times a day (TID) | INTRAMUSCULAR | Status: DC

## 2022-04-29 MED ORDER — LINEZOLID 600 MG PO TABS
600.0000 mg | ORAL_TABLET | Freq: Two times a day (BID) | ORAL | 0 refills | Status: AC
Start: 2022-04-29 — End: 2022-05-06

## 2022-04-29 MED ORDER — LINEZOLID 600 MG/300ML IV SOLN: 600.0000 mg | Freq: Two times a day (BID) | INTRAVENOUS | Status: DC

## 2022-04-29 MED ORDER — OXYCODONE HCL 5 MG PO TABS: 5.0000 mg | ORAL_TABLET | Freq: Four times a day (QID) | ORAL | 0 refills | Status: AC | PRN

## 2022-04-29 MED ORDER — INSULIN ASPART 100 UNIT/ML IJ SOLN: 0.0000 [IU] | Freq: Every day | INTRAMUSCULAR | Status: DC

## 2022-04-29 NOTE — Plan of Care (Signed)

## 2022-04-29 NOTE — Progress Notes (Signed)
     Subjective: 1 Day Post-Op Procedure(s) (LRB): INCISION AND DRAINAGE RIGHT RING FINGER (Right)   Patient reports pain as mild, pain controlled.  Still having swelling. Feels that he is doing well and ready to return home.    Objective:   VITALS:   Vitals:   04/29/22 0314 04/29/22 0749  BP: 129/78 110/74  Pulse: 76 71  Resp: 20 20  Temp: 98.3 F (36.8 C) 97.7 F (36.5 C)  SpO2: 98% 97%    Neurovascular intact Compartment soft  LABS Recent Labs    04/27/22 2059 04/28/22 0526 04/29/22 0833  HGB 13.4 12.0* 12.3*  HCT 40.0 36.0* 36.2*  WBC 10.3 9.4 9.7  PLT 251 212 233    Recent Labs    04/27/22 2059 04/28/22 0526 04/29/22 0833  NA 139 137 135  K 4.2 3.7 3.8  BUN 22* 13 10  CREATININE 0.98 0.78 0.74  GLUCOSE 118* 116* 106*     Assessment/Plan: 1 Day Post-Op Procedure(s) (LRB): INCISION AND DRAINAGE RIGHT RING FINGER (Right)  Should be stable to return home as long as antibiotic are arranged.   POSTOPERATIVE PLAN: Patient will be admitted overnight.  We will follow-up cultures.  Patient can likely be transition to oral antibiotics and discharged home tomorrow.  Recommend continued elevation of the hand and removal of the iodoform in the next 2 to 3 days followed by twice daily soapy soaks.   Lanney Gins PA-C  [facility]

## 2022-04-29 NOTE — Discharge Summary (Signed)
Physician Discharge Summary  Preston Crawford NTI:[id number] DOB: 03-31-1971 DOA: 04/27/2022  PCP: Ermalinda Memos, MD  Admit date: 04/27/2022 Discharge date: 04/29/2022  Admitted From: Home Disposition:  Home  Recommendations for Outpatient Follow-up:  Follow up with PCP in 1-2 weeks Follow-up outpatient orthopedics 2 weeks  Home Health: No Equipment/Devices: None  Discharge Condition: Stable CODE STATUS: Full Diet recommendation: Carb modified  Brief/Interim Summary: 51 y.o. male with medical history significant for prediabetes and psoriasis, who presented to the emergency room with acute onset of right ring finger swelling and worsening pain and erythema.  He has been having diminished range of motion and today has not been able to fully flex his right ring finger.  He was evaluated a couple of days at an urgent care and was started on p.o. Bactrim.  Later on he was prescribed p.o. Keflex.  No fever or chills or drainage.  He denies any tingling or numbness.   Initially orthopedics was consulted by EDP at [facility].  Recommended transfer as he felt wound would likely need surgical incision and drainage.  Transfer to [facility] were attempted however no beds.  EDP did speak with hand surgery at [facility] who indicated this patient would not need transfer to higher level of care.  Orthopedics aware and will consult at [facility].   Patient is resting comfortably in bed.  He reports pain only in affected finger.  No systemic signs.  No fevers chills or diaphoresis.  Orthopedics Dr. Okey Dupre aware of patient and will take to operating room 9/7 for incision and drainage.  Patient status post incision and drainage in operating room.  Copious amounts of purulent material liberated from wound.  Wound culture not growing however suspect MRSA given purulence.  Suspect negative culture given previous exposure to antibiotics.  Cleared by orthopedics for discharge.  Recommend arm elevation above the level of the  heart for the next 5 to 7 days.  As needed ibuprofen and oxycodone.  P.o. Zyvox 600 mg twice daily x7 days.  Follow-up in orthopedic clinic in 2 weeks.  Patient can remove iodoform packing in 2 to 3 days.  Warm soapy water soaks 1-2 times daily.  Post discharge plan explained at length the patient at bedside.  All questions answered.  Patient Is understanding.  Discharge Diagnoses:  Principal Problem:   Abscess of right middle finger Active Problems:   Type 2 diabetes mellitus without complications (HCC)   Psoriasis   Alcoholic encephalopathy (HCC)  Right middle finger cellulitis with abscess Unclear triggering event No systemic signs Patient not septic or toxic appearing Status post I&D on 9/7 Already procedure well, no complications He is purulent material liberated from wound Plan: Discharge home.  Zyvox 600 mg twice daily x7 days.  Elevate arm above level of heart for To 7 days.  Can remove iodoform packing in 2 to 3 days.  Warm soapy water soaks 1-2 times daily.  Follow-up outpatient orthopedics 2 weeks.  Discharge Instructions  Discharge Instructions     Diet - low sodium heart healthy   Complete by: As directed    Increase activity slowly   Complete by: As directed    No wound care   Complete by: As directed       Allergies as of 04/29/2022   No Known Allergies      Medication List     STOP taking these medications    cephALEXin 500 MG capsule Commonly known as: KEFLEX   sulfamethoxazole-trimethoprim 800-160 MG tablet Commonly  known as: BACTRIM DS   valACYclovir 1000 MG tablet Commonly known as: VALTREX       TAKE these medications    linezolid 600 MG tablet Commonly known as: ZYVOX Take 1 tablet (600 mg total) by mouth every 12 (twelve) hours for 7 days.   metFORMIN 500 MG 24 hr tablet Commonly known as: GLUCOPHAGE-XR Take 500 mg by mouth daily.   multivitamin with minerals tablet Take 1 tablet by mouth daily.   mupirocin ointment 2  % Commonly known as: BACTROBAN Apply 1 Application topically 2 (two) times daily.   oxyCODONE 5 MG immediate release tablet Commonly known as: Roxicodone Take 1 tablet (5 mg total) by mouth every 6 (six) hours as needed for up to 5 days for severe pain.   Stelara 45 MG/0.5ML Soln Generic drug: Ustekinumab Inject into the skin. " Takes every 3 months"        Follow-up Information     Zanette, Murilo, PA-C. Go in 2 week(s).   Specialty: Orthopedic Surgery Why: Dr. Frutoso Chase PA.  9/21 at 10:40 AM Contact information: [address] [place] [address] [phone]                No Known Allergies  Consultations: Orthopedics   Procedures/Studies: DG Hand Complete Right  Result Date: 04/27/2022 CLINICAL DATA:  Fourth finger infection. EXAM: RIGHT HAND - COMPLETE 3+ VIEW COMPARISON:  Radiograph dated 04/25/2022. FINDINGS: There is no acute fracture or dislocation. The bones are well mineralized. No arthritic changes. No bone erosion or periosteal elevation. Diffuse soft tissue swelling of the dorsum of the hand as well as soft tissue swelling of the fourth digit. No opaque foreign object/gas. IMPRESSION: 1. No acute osseous pathology. 2. Soft tissue swelling may represent cellulitis. Clinical correlation is recommended. Electronically Signed   By: Elgie Collard M.D.   On: 04/27/2022 21:13   DG Finger Ring Right  Result Date: 04/25/2022 CLINICAL DATA:  Ring finger pain and swelling for 2 days. No known injury. EXAM: RIGHT RING FINGER 2+V COMPARISON:  None Available. FINDINGS: There is no evidence of fracture or dislocation. There is no evidence of arthropathy or other focal bone abnormality. Proximal soft tissue swelling is seen, without evidence of soft tissue gas or radiopaque foreign body. IMPRESSION: Proximal soft tissue swelling. No osseous abnormality. Electronically Signed   By: Danae Orleans M.D.   On: 04/25/2022 12:07      Subjective: Seen and examined on  day of discharge.  Stable no distress.  Pain well controlled.  Right hand swollen but adequate range of motion.  Discharge Exam: Vitals:   04/29/22 0314 04/29/22 0749  BP: 129/78 110/74  Pulse: 76 71  Resp: 20 20  Temp: 98.3 F (36.8 C) 97.7 F (36.5 C)  SpO2: 98% 97%   Vitals:   04/28/22 1959 04/28/22 2102 04/29/22 0314 04/29/22 0749  BP: (!) 150/85 (!) 142/82 129/78 110/74  Pulse: 84 93 76 71  Resp: 16 20 20 20   Temp: 98 F (36.7 C) (!) 101.4 F (38.6 C) 98.3 F (36.8 C) 97.7 F (36.5 C)  TempSrc: Oral Oral Oral Oral  SpO2: 100% 97% 98% 97%    General: Pt is alert, awake, not in acute distress Cardiovascular: RRR, S1/S2 +, no rubs, no gallops Respiratory: CTA bilaterally, no wheezing, no rhonchi Abdominal: Soft, NT, ND, bowel sounds + Extremities: Right hand edematous.  No pain on active or passive range of motion.    The results of significant diagnostics from  this hospitalization (including imaging, microbiology, ancillary and laboratory) are listed below for reference.     Microbiology: Recent Results (from the past 240 hour(s))  Aerobic/Anaerobic Culture w Gram Stain (surgical/deep wound)     Status: None (Preliminary result)   Collection Time: 04/28/22  6:02 PM   Specimen: PATH Other; Body Fluid  Result Value Ref Range Status   Specimen Description   Final    FINGER RIGHT MIDDLE I AND D Performed at [facility], [address] [place], [place] [address]    Special Requests   Final    NONE Performed at [facility], [address] [place], [place] [address]    Gram Stain   Final    FEW WBC PRESENT, PREDOMINANTLY PMN FEW GRAM POSITIVE COCCI IN PAIRS    Culture   Final    NO GROWTH < 12 HOURS Performed at [facility], [address] [place], [place] [address]    Report Status PENDING  Incomplete     Labs: BNP (last 3 results) No results for input(s): "BNP" in the last 8760 hours. Basic Metabolic Panel: Recent Labs   Lab 04/25/22 1134 04/27/22 2059 04/28/22 0526 04/29/22 0833  NA 137 139 137 135  K 4.3 4.2 3.7 3.8  CL 109 107 112* 106  CO2 24 24 20* 21*  GLUCOSE 101* 118* 116* 106*  BUN 19 22* 13 10  CREATININE 0.75 0.98 0.78 0.74  CALCIUM 8.9 9.2 8.3* 8.6*   Liver Function Tests: No results for input(s): "AST", "ALT", "ALKPHOS", "BILITOT", "PROT", "ALBUMIN" in the last 168 hours. No results for input(s): "LIPASE", "AMYLASE" in the last 168 hours. No results for input(s): "AMMONIA" in the last 168 hours. CBC: Recent Labs  Lab 04/25/22 1134 04/27/22 2059 04/28/22 0526 04/29/22 0833  WBC 7.9 10.3 9.4 9.7  NEUTROABS 5.6 7.8*  --  6.7  HGB 13.6 13.4 12.0* 12.3*  HCT 40.3 40.0 36.0* 36.2*  MCV 89.6 88.9 89.1 87.0  PLT 226 251 212 233   Cardiac Enzymes: No results for input(s): "CKTOTAL", "CKMB", "CKMBINDEX", "TROPONINI" in the last 168 hours. BNP: Invalid input(s): "POCBNP" CBG: Recent Labs  Lab 04/29/22 0751 04/29/22 1154  GLUCAP 112* 111*   D-Dimer No results for input(s): "DDIMER" in the last 72 hours. Hgb A1c No results for input(s): "HGBA1C" in the last 72 hours. Lipid Profile No results for input(s): "CHOL", "HDL", "LDLCALC", "TRIG", "CHOLHDL", "LDLDIRECT" in the last 72 hours. Thyroid function studies No results for input(s): "TSH", "T4TOTAL", "T3FREE", "THYROIDAB" in the last 72 hours.  Invalid input(s): "FREET3" Anemia work up No results for input(s): "VITAMINB12", "FOLATE", "FERRITIN", "TIBC", "IRON", "RETICCTPCT" in the last 72 hours. Urinalysis    Component Value Date/Time   APPEARANCEUR Clear 06/26/2017 1012   GLUCOSEU Negative 06/26/2017 1012   BILIRUBINUR Negative 06/26/2017 1012   PROTEINUR Negative 06/26/2017 1012   NITRITE Negative 06/26/2017 1012   LEUKOCYTESUR Negative 06/26/2017 1012   Sepsis Labs Recent Labs  Lab 04/25/22 1134 04/27/22 2059 04/28/22 0526 04/29/22 0833  WBC 7.9 10.3 9.4 9.7   Microbiology Recent Results (from the past  240 hour(s))  Aerobic/Anaerobic Culture w Gram Stain (surgical/deep wound)     Status: None (Preliminary result)   Collection Time: 04/28/22  6:02 PM   Specimen: PATH Other; Body Fluid  Result Value Ref Range Status   Specimen Description   Final    FINGER RIGHT MIDDLE I AND D Performed at [facility], [address] [place],  [place] [address]    Special Requests   Final    NONE Performed at [facility], [address] [place], [place] [address]    Gram Stain   Final    FEW WBC PRESENT, PREDOMINANTLY PMN FEW GRAM POSITIVE COCCI IN PAIRS    Culture   Final    NO GROWTH < 12 HOURS Performed at [facility], [address], [place], [place] [address]    Report Status PENDING  Incomplete     Time coordinating discharge: Over 30 minutes  SIGNED:   Tresa Eastmond, MD  [facility] 04/29/2022, 1:56 PM Pager   If 7PM-7AM, please contact night-coverage

## 2022-04-29 NOTE — Progress Notes (Signed)
Patient discharged to home with all belongings. PIVX1 removed. No comments or concerns at time of discharge.

## 2022-04-29 NOTE — TOC Initial Note (Signed)
Transition of Care [facility]) - Initial/Assessment Note    Patient Details  Name: Preston Crawford MRN: [id number] Date of Birth: 09-30-1970  Transition of Care [facility]) CM/SW Contact:    Chapman Fitch, RN Phone Number: 04/29/2022, 1:29 PM  Clinical Narrative:                   Transition of Care [facility]) Screening Note   Patient Details  Name: Preston Crawford Date of Birth: 1970/11/08   Transition of Care [facility]) CM/SW Contact:    Chapman Fitch, RN Phone Number: 04/29/2022, 1:29 PM    Transition of Care Department [facility]) has reviewed patient and no TOC needs have been identified at this time. We will continue to monitor patient advancement through interdisciplinary progression rounds. If new patient transition needs arise, please place a TOC consult.         Patient Goals and CMS Choice        Expected Discharge Plan and Services           Expected Discharge Date: 04/29/22                                    Prior Living Arrangements/Services                       Activities of Daily Living Home Assistive Devices/Equipment: None ADL Screening (condition at time of admission) Patient's cognitive ability adequate to safely complete daily activities?: Yes Is the patient deaf or have difficulty hearing?: No Does the patient have difficulty seeing, even when wearing glasses/contacts?: No Does the patient have difficulty concentrating, remembering, or making decisions?: No Patient able to express need for assistance with ADLs?: Yes Does the patient have difficulty dressing or bathing?: No Independently performs ADLs?: Yes (appropriate for developmental age) Does the patient have difficulty walking or climbing stairs?: No Weakness of Legs: None Weakness of Arms/Hands: None  Permission Sought/Granted                  Emotional Assessment              Admission diagnosis:  Cellulitis and abscess of hand [L03.119, L02.519] Alcoholic encephalopathy  (HCC) [G31.2] Abscess of right middle finger [L02.511] Patient Active Problem List   Diagnosis Date Noted   Type 2 diabetes mellitus without complications (HCC) 04/28/2022   Psoriasis 04/28/2022   Alcoholic encephalopathy (HCC) 25/85/2778   Abscess of right middle finger 04/27/2022   PCP:  Jose-Mathews, [name], MD Pharmacy:   [facility] [phone] - [place], [place] - [address] [place] [address] Phone: [phone] Fax: [phone]     Social Determinants of Health (SDOH) Interventions    Readmission Risk Interventions     No data to display

## 2022-04-29 NOTE — Progress Notes (Signed)
Discharge instructions reviewed with patient at the bedside. Quesitons were answered, PIV removed with no complications. Patient Dcing home

## 2022-05-02 NOTE — Anesthesia Postprocedure Evaluation (Signed)
Anesthesia Post Note  Patient: Esley Brooking  Procedure(s) Performed: INCISION AND DRAINAGE RIGHT RING FINGER (Right: Finger)  Patient location during evaluation: PACU Anesthesia Type: General Level of consciousness: awake and alert Pain management: pain level controlled Vital Signs Assessment: post-procedure vital signs reviewed and stable Respiratory status: spontaneous breathing, nonlabored ventilation, respiratory function stable and patient connected to nasal cannula oxygen Cardiovascular status: blood pressure returned to baseline and stable Postop Assessment: no apparent nausea or vomiting Anesthetic complications: no   No notable events documented.   Last Vitals:  Vitals:   04/29/22 0314 04/29/22 0749  BP: 129/78 110/74  Pulse: 76 71  Resp: 20 20  Temp: 36.8 C 36.5 C  SpO2: 98% 97%    Last Pain:  Vitals:   04/29/22 0900  TempSrc:   PainSc: Asleep                 Yevette Edwards

## 2022-05-03 LAB — AEROBIC/ANAEROBIC CULTURE W GRAM STAIN (SURGICAL/DEEP WOUND)

## 2023-08-09 ENCOUNTER — Emergency Department
Admission: EM | Admit: 2023-08-09 | Discharge: 2023-08-10 | Disposition: A | Discharge status: Home / Self Care | Payer: No Typology Code available for payment source | Attending: Emergency Medicine | Admitting: Emergency Medicine

## 2023-08-09 ENCOUNTER — Emergency Department: Payer: No Typology Code available for payment source

## 2023-08-09 ENCOUNTER — Other Ambulatory Visit: Payer: Self-pay

## 2023-08-09 DIAGNOSIS — R0602 Shortness of breath: Secondary | ICD-10-CM | POA: Insufficient documentation

## 2023-08-09 DIAGNOSIS — R0789 Other chest pain: Secondary | ICD-10-CM | POA: Insufficient documentation

## 2023-08-09 DIAGNOSIS — E119 Type 2 diabetes mellitus without complications: Secondary | ICD-10-CM | POA: Insufficient documentation

## 2023-08-09 DIAGNOSIS — R5383 Other fatigue: Secondary | ICD-10-CM | POA: Insufficient documentation

## 2023-08-09 DIAGNOSIS — Z7984 Long term (current) use of oral hypoglycemic drugs: Secondary | ICD-10-CM | POA: Insufficient documentation

## 2023-08-09 DIAGNOSIS — Z20822 Contact with and (suspected) exposure to covid-19: Secondary | ICD-10-CM | POA: Diagnosis not present

## 2023-08-09 LAB — CBC
HCT: 40 % (ref 39.0–52.0)
Hemoglobin: 13.6 g/dL (ref 13.0–17.0)
MCH: 29.7 pg (ref 26.0–34.0)
MCHC: 34 g/dL (ref 30.0–36.0)
MCV: 87.3 fL (ref 80.0–100.0)
Platelets: 253 10*3/uL (ref 150–400)
RBC: 4.58 MIL/uL (ref 4.22–5.81)
RDW: 12.7 % (ref 11.5–15.5)
WBC: 5.3 10*3/uL (ref 4.0–10.5)
nRBC: 0 % (ref 0.0–0.2)

## 2023-08-09 LAB — COMPREHENSIVE METABOLIC PANEL
ALT: 59 U/L — ABNORMAL HIGH (ref 0–44)
AST: 37 U/L (ref 15–41)
Albumin: 4.3 g/dL (ref 3.5–5.0)
Alkaline Phosphatase: 30 U/L — ABNORMAL LOW (ref 38–126)
Anion gap: 10 (ref 5–15)
BUN: 18 mg/dL (ref 6–20)
CO2: 22 mmol/L (ref 22–32)
Calcium: 9 mg/dL (ref 8.9–10.3)
Chloride: 105 mmol/L (ref 98–111)
Creatinine, Ser: 0.9 mg/dL (ref 0.61–1.24)
GFR, Estimated: >60 mL/min (ref >60)
Glucose, Bld: 126 mg/dL — ABNORMAL HIGH (ref 70–99)
Potassium: 3.9 mmol/L (ref 3.5–5.1)
Sodium: 137 mmol/L (ref 135–145)
Total Bilirubin: 0.8 mg/dL (ref <1.2)
Total Protein: 7 g/dL (ref 6.5–8.1)

## 2023-08-09 LAB — TROPONIN I (HIGH SENSITIVITY): Troponin I (High Sensitivity): 3 ng/L (ref <18)

## 2023-08-09 LAB — RESP PANEL BY RT-PCR (RSV, FLU A&B, COVID)  RVPGX2
Influenza A by PCR: NEGATIVE
Influenza B by PCR: NEGATIVE
Resp Syncytial Virus by PCR: NEGATIVE
SARS Coronavirus 2 by RT PCR: NEGATIVE

## 2023-08-09 NOTE — ED Triage Notes (Signed)
SOB and chest tightness x 2 hours. Reports abnormally fatigued x 2 days. Pt denies hx of same or known sick contact. Denies fever, cough, congestion, h/a, parasthesia. Reports mild dizziness. Pt ambulatory to triage. Alert and oriented. Breathing unlabored in triage and speaking in full sentences. Symmetric chest rise and fall.

## 2023-08-10 LAB — D-DIMER, QUANTITATIVE: D-Dimer, Quant: 0.28 ug{FEU}/mL (ref 0.00–0.50)

## 2023-08-10 LAB — T4, FREE: Free T4: 0.57 ng/dL — ABNORMAL LOW (ref 0.61–1.12)

## 2023-08-10 LAB — TSH: TSH: 3.909 u[IU]/mL (ref 0.350–4.500)

## 2023-08-10 LAB — TROPONIN I (HIGH SENSITIVITY): Troponin I (High Sensitivity): 3 ng/L (ref <18)

## 2023-08-10 NOTE — ED Provider Notes (Signed)
[facility] Provider Note    Event Date/Time   First MD Initiated Contact with Patient 08/10/23 0017     (approximate)   History   Shortness of Breath and Chest Pain   HPI  Preston Crawford is a 52 y.o. male   Past medical history of diabetes on metformin only who presents to the emergency department with chest tightness and shortness of breath for the last 2 days.  Also fatigued.  Denies radiation of chest pain.  Nonpleuritic.  No cough, sore throat, congestion or sick contacts.  No fever.  No GI or GU symptoms.  No history of clots leg swelling or immobilization.  Independent Historian contributed to assessment above: Wife is at bedside corroborates information past medical history as above    Physical Exam   Triage Vital Signs: ED Triage Vitals  Encounter Vitals Group     BP 08/09/23 2202 130/82     Systolic BP Percentile --      Diastolic BP Percentile --      Pulse Rate 08/09/23 2202 64     Resp 08/09/23 2202 20     Temp 08/09/23 2202 98.1 F (36.7 C)     Temp Source 08/09/23 2202 Oral     SpO2 08/09/23 2202 98 %     Weight 08/09/23 2201 200 lb (90.7 kg)     Height 08/09/23 2201 5\' 8"  (1.727 m)     Head Circumference --      Peak Flow --      Pain Score 08/10/23 0217 3     Pain Loc --      Pain Education --      Exclude from Growth Chart --     Most recent vital signs: Vitals:   08/09/23 2202 08/10/23 0217  BP: 130/82 131/84  Pulse: 64 (!) 50  Resp: 20 16  Temp: 98.1 F (36.7 C) 97.6 F (36.4 C)  SpO2: 98% 98%    General: Awake, no distress.  CV:  Good peripheral perfusion.  Resp:  Normal effort.  Abd:  No distention.  Other:  Well-appearing patient no acute distress.  Slightly bradycardic otherwise vital signs normal no hypoxemia breathing comfortably.  Clear lungs with no wheezing or focalities.  No unilateral leg swelling or pain.  Soft nontender abdomen.  Heart sounds normal.   ED Results / Procedures / Treatments    Labs (all labs ordered are listed, but only abnormal results are displayed) Labs Reviewed  COMPREHENSIVE METABOLIC PANEL - Abnormal; Notable for the following components:      Result Value   Glucose, Bld 126 (*)    ALT 59 (*)    Alkaline Phosphatase 30 (*)    All other components within normal limits  RESP PANEL BY RT-PCR (RSV, FLU A&B, COVID)  RVPGX2  CBC  D-DIMER, QUANTITATIVE  TSH  T4, FREE  TROPONIN I (HIGH SENSITIVITY)  TROPONIN I (HIGH SENSITIVITY)     I ordered and reviewed the above labs they are notable for electrolytes and cell counts largely unremarkable, serial troponins flat x 2  EKG  ED ECG REPORT I, Pilar Jarvis, the attending physician, personally viewed and interpreted this ECG.   Date: 08/10/2023  EKG Time: 2202  Rate: 61  Rhythm: sinus  Axis: nl  Intervals:none  ST&T Change: no stemi    RADIOLOGY I independently reviewed and interpreted chest x-ray and I see no obvious focality or pneumothorax I also reviewed radiologist's formal read.   PROCEDURES:  Critical Care performed: No  Procedures   MEDICATIONS ORDERED IN ED: Medications - No data to display   IMPRESSION / MDM / ASSESSMENT AND PLAN / ED COURSE  I reviewed the triage vital signs and the nursing notes.                                Patient's presentation is most consistent with acute presentation with potential threat to life or bodily function.  Differential diagnosis includes, but is not limited to, ACS, PE, dissection, respiratory infection, pneumothorax   The patient is on the cardiac monitor to evaluate for evidence of arrhythmia and/or significant heart rate changes.  MDM:    Chest tightness and shortness of breath over the last 2 days along with fatigue and this otherwise healthy 52 year old patient.  No respiratory infectious symptoms and a clear chest x-ray doubt bacterial pneumonia or other respiratory infection, viral panel is negative.  Largely benign exam, no  symptoms currently.  Serial troponins have been flat nonischemic EKG I do not think he has ACS.  Considered PE.  Will start with dimer.  Check thyroid.  No wheezing to suggest COPD in this non-smoker or asthma.  No evidence of fluid overload to suggest CHF.   If negative, I am not sure what is causing his symptoms but unlikely cardiopulmonary emergency.  He will follow-up with PMD.        FINAL CLINICAL IMPRESSION(S) / ED DIAGNOSES   Final diagnoses:  Shortness of breath  Chest tightness     Rx / DC Orders   ED Discharge Orders     None        Note:  This document was prepared using Dragon voice recognition software and may include unintentional dictation errors.    Pilar Jarvis, MD 08/10/23 0300

## 2023-08-10 NOTE — Discharge Instructions (Signed)
Fortunately, your evaluation in the emergency department did not show any emergency conditions account for your symptoms.  Thyroid levels were very slightly low.  It is not enough to start on medication but I think it is important to have your doctor recheck these levels within the next 1 to 2 weeks.  Thank you for choosing [place] for your health care today!  Please see your primary doctor this week for a follow up appointment.   If you have any new, worsening, or unexpected symptoms call your doctor right away or come back to the emergency department for reevaluation.  It was my pleasure to care for you today.   Daneil Dan Modesto Charon, MD

## 2023-11-08 ENCOUNTER — Encounter: Payer: Self-pay | Admitting: Family Medicine

## 2023-11-16 ENCOUNTER — Other Ambulatory Visit: Payer: Self-pay | Admitting: Family Medicine

## 2023-11-16 DIAGNOSIS — Z8719 Personal history of other diseases of the digestive system: Secondary | ICD-10-CM

## 2023-11-16 DIAGNOSIS — R1031 Right lower quadrant pain: Secondary | ICD-10-CM

## 2023-11-20 ENCOUNTER — Other Ambulatory Visit: Payer: Self-pay | Admitting: Family Medicine

## 2023-11-20 DIAGNOSIS — R1031 Right lower quadrant pain: Secondary | ICD-10-CM

## 2023-11-20 DIAGNOSIS — Z8719 Personal history of other diseases of the digestive system: Secondary | ICD-10-CM

## 2023-11-21 ENCOUNTER — Ambulatory Visit
Admission: RE | Admit: 2023-11-21 | Discharge: 2023-11-21 | Disposition: A | Discharge status: Home / Self Care | Source: Ambulatory Visit | Attending: Family Medicine | Admitting: Family Medicine

## 2023-11-21 DIAGNOSIS — R1031 Right lower quadrant pain: Secondary | ICD-10-CM

## 2023-11-21 DIAGNOSIS — Z8719 Personal history of other diseases of the digestive system: Secondary | ICD-10-CM
